# Patient Record
Sex: Female | Born: 1965 | Race: White | Hispanic: No | Marital: Married | Smoking: Never smoker
Health system: Southern US, Community
[De-identification: ages and names within clinical notes are randomized; demographics above are authoritative.]

## PROBLEM LIST (undated history)

## (undated) DIAGNOSIS — B019 Varicella without complication: Secondary | ICD-10-CM

## (undated) DIAGNOSIS — I341 Nonrheumatic mitral (valve) prolapse: Secondary | ICD-10-CM

## (undated) HISTORY — DX: Varicella without complication: B01.9

## (undated) HISTORY — DX: Nonrheumatic mitral (valve) prolapse: I34.1

## (undated) HISTORY — PX: ABDOMINAL HYSTERECTOMY: SHX81

---

## 1987-08-01 HISTORY — PX: WISDOM TOOTH EXTRACTION: SHX21

## 1996-07-31 HISTORY — PX: GANGLION CYST EXCISION: SHX1691

## 2018-09-05 ENCOUNTER — Encounter: Payer: Self-pay | Admitting: Family Medicine

## 2018-09-05 ENCOUNTER — Ambulatory Visit (INDEPENDENT_AMBULATORY_CARE_PROVIDER_SITE_OTHER): Payer: 59 | Admitting: Family Medicine

## 2018-09-05 VITALS — BP 112/80 | HR 60 | Temp 97.9°F | Ht 67.0 in | Wt 136.6 lb

## 2018-09-05 DIAGNOSIS — I341 Nonrheumatic mitral (valve) prolapse: Secondary | ICD-10-CM

## 2018-09-05 DIAGNOSIS — Z0000 Encounter for general adult medical examination without abnormal findings: Secondary | ICD-10-CM | POA: Diagnosis not present

## 2018-09-05 DIAGNOSIS — Z1239 Encounter for other screening for malignant neoplasm of breast: Secondary | ICD-10-CM | POA: Diagnosis not present

## 2018-09-05 DIAGNOSIS — Z862 Personal history of diseases of the blood and blood-forming organs and certain disorders involving the immune mechanism: Secondary | ICD-10-CM

## 2018-09-05 DIAGNOSIS — Z23 Encounter for immunization: Secondary | ICD-10-CM

## 2018-09-05 NOTE — Progress Notes (Signed)
Debbie Glover is a 53 y.o. female  Chief Complaint  Patient presents with  . Establish Care    CPE-- not fasting/ No OBGYN,/ Mammogram 2018/ colonoscopy 2017/ thinks she's receieved TDAP in 10 year/ wants to talk about flu shot? Cardiologist referral?    HPI: Debbie Glover is a 53 y.o. female here as a new patient to establish care with our office. She moved in 2018 from [place].  She is UTD on PAP, mammo, colo. She had Tdap in 2013. She would like flu vaccine today. She is not fasting for labs and will RTO to have these done.  Specialists: cardiology (annually) - MVP and syncopal episode x 1 in 2017  Last PAP: s/p partial hysterectomy - 2018 Last mammo: 2018 Last Dexa: n/a Last colonoscopy: 2016 - normal/no polyps - due in 2026  Med refills needed today: none   Past Medical History:  Diagnosis Date  . Chicken pox   . Mitral valve prolapse     Past Surgical History:  Procedure Laterality Date  . ABDOMINAL HYSTERECTOMY     just uterus  . GANGLION CYST EXCISION  1998  . WISDOM TOOTH EXTRACTION  1989    Social History   Socioeconomic History  . Marital status: Married    Spouse name: Not on file  . Number of children: Not on file  . Years of education: Not on file  . Highest education level: Not on file  Occupational History  . Not on file  Social Needs  . Financial resource strain: Not on file  . Food insecurity:    Worry: Not on file    Inability: Not on file  . Transportation needs:    Medical: Not on file    Non-medical: Not on file  Tobacco Use  . Smoking status: Never Smoker  . Smokeless tobacco: Never Used  Substance and Sexual Activity  . Alcohol use: Yes    Comment: socially  . Drug use: Never  . Sexual activity: Not on file  Lifestyle  . Physical activity:    Days per week: Not on file    Minutes per session: Not on file  . Stress: Not on file  Relationships  . Social connections:    Talks on phone: Not on file    Gets together: Not on  file    Attends religious service: Not on file    Active member of club or organization: Not on file    Attends meetings of clubs or organizations: Not on file    Relationship status: Not on file  . Intimate partner violence:    Fear of current or ex partner: Not on file    Emotionally abused: Not on file    Physically abused: Not on file    Forced sexual activity: Not on file  Other Topics Concern  . Not on file  Social History Narrative  . Not on file    Family History  Problem Relation Age of Onset  . Cancer Mother        breast  . Hypertension Mother   . Congestive Heart Failure Mother   . Pulmonary Hypertension Mother   . Cancer Father        prostate and skin  . Glaucoma Father   . Macular degeneration Father   . Diabetes Maternal Grandfather   . Stroke Maternal Grandfather   . Cancer Paternal Grandfather       There is no immunization history on file for this patient.  No outpatient encounter medications on file as of 09/05/2018.   No facility-administered encounter medications on file as of 09/05/2018.      ROS: Gen: no fever, chills  Skin: no rash, itching ENT: no ear pain, ear drainage, nasal congestion, rhinorrhea, sinus pressure, sore throat Eyes: no blurry vision, double vision Resp: no cough, wheeze,SOB CV: no CP, palpitations, LE edema,  GI: no heartburn, n/v/d/c, abd pain GU: no dysuria, urgency, frequency, hematuria  MSK: no joint pain, myalgias, back pain Neuro: no dizziness, headache, weakness, vertigo Psych: no depression, anxiety, insomnia   Allergies  Allergen Reactions  . Codeine Itching    BP 112/80   Pulse 60   Temp 97.9 F (36.6 C) (Oral)   Ht 5\' 7"  (1.702 m)   Wt 136 lb 9.6 oz (62 kg)   SpO2 97%   BMI 21.39 kg/m   Physical Exam  Constitutional: She is oriented to person, place, and time. She appears well-developed and well-nourished. No distress.  HENT:  Head: Normocephalic and atraumatic.  Right Ear: Tympanic membrane  and ear canal normal.  Left Ear: Tympanic membrane and ear canal normal.  Nose: Nose normal.  Mouth/Throat: Oropharynx is clear and moist.  Eyes: Pupils are equal, round, and reactive to light. Conjunctivae are normal.  Neck: Normal range of motion. Neck supple. No thyromegaly present.  Cardiovascular: Normal rate, regular rhythm and intact distal pulses.  Pulmonary/Chest: Effort normal and breath sounds normal. No respiratory distress. She has no wheezes. She has no rhonchi. Right breast exhibits no mass, no nipple discharge, no skin change and no tenderness. Left breast exhibits no mass, no nipple discharge, no skin change and no tenderness.  Abdominal: Bowel sounds are normal. She exhibits no distension and no mass. There is no abdominal tenderness.  Musculoskeletal: Normal range of motion.        General: No edema.  Lymphadenopathy:    She has no cervical adenopathy.  Neurological: She is alert and oriented to person, place, and time. She has normal reflexes. No cranial nerve deficit.  Skin: Skin is warm and dry.  Psychiatric: She has a normal mood and affect. Her behavior is normal.     A/P:  1. MVP (mitral valve prolapse) - Ambulatory referral to Cardiology  2. Screening for breast cancer - MM DIGITAL SCREENING BILATERAL; Future  3. Need for influenza vaccination - Flu Vaccine QUAD 6+ mos PF IM (Fluarix Quad PF)  4. Annual physical exam - cont with regular exercise (yoga, walking) and healthy diet - UTD on immunizations - will RTO for fasting labs - UTD on PAP, mammo, colo - ALT - AST - Basic metabolic panel - VITAMIN D 25 Hydroxy (Vit-D Deficiency, Fractures) - Lipid panel - CBC - Iron, TIBC and Ferritin Panel - next CPE in 1 year  5. History of anemia - CBC - Iron, TIBC and Ferritin Panel

## 2018-09-10 ENCOUNTER — Other Ambulatory Visit: Payer: 59

## 2018-09-13 ENCOUNTER — Other Ambulatory Visit (INDEPENDENT_AMBULATORY_CARE_PROVIDER_SITE_OTHER): Payer: 59

## 2018-09-13 DIAGNOSIS — Z0000 Encounter for general adult medical examination without abnormal findings: Secondary | ICD-10-CM | POA: Diagnosis not present

## 2018-09-13 DIAGNOSIS — Z862 Personal history of diseases of the blood and blood-forming organs and certain disorders involving the immune mechanism: Secondary | ICD-10-CM | POA: Diagnosis not present

## 2018-09-13 LAB — CBC
HCT: 40.1 % (ref 36.0–46.0)
Hemoglobin: 13.4 g/dL (ref 12.0–15.0)
MCHC: 33.3 g/dL (ref 30.0–36.0)
MCV: 90.4 fl (ref 78.0–100.0)
Platelets: 196 10*3/uL (ref 150.0–400.0)
RBC: 4.44 Mil/uL (ref 3.87–5.11)
RDW: 13 % (ref 11.5–15.5)
WBC: 4.7 10*3/uL (ref 4.0–10.5)

## 2018-09-13 LAB — LIPID PANEL
Cholesterol: 211 mg/dL — ABNORMAL HIGH (ref 0–200)
HDL: 93.1 mg/dL (ref >39.00)
LDL Cholesterol: 108 mg/dL — ABNORMAL HIGH (ref 0–99)
NonHDL: 117.71
TRIGLYCERIDES: 48 mg/dL (ref 0.0–149.0)
Total CHOL/HDL Ratio: 2
VLDL: 9.6 mg/dL (ref 0.0–40.0)

## 2018-09-13 LAB — BASIC METABOLIC PANEL
BUN: 11 mg/dL (ref 6–23)
CO2: 27 mEq/L (ref 19–32)
Calcium: 9.5 mg/dL (ref 8.4–10.5)
Chloride: 104 mEq/L (ref 96–112)
Creatinine, Ser: 0.8 mg/dL (ref 0.40–1.20)
GFR: 74.9 mL/min (ref >60.00)
Glucose, Bld: 91 mg/dL (ref 70–99)
Potassium: 4.4 mEq/L (ref 3.5–5.1)
SODIUM: 141 meq/L (ref 135–145)

## 2018-09-13 LAB — ALT: ALT: 15 U/L (ref 0–35)

## 2018-09-13 LAB — VITAMIN D 25 HYDROXY (VIT D DEFICIENCY, FRACTURES): VITD: 27.09 ng/mL — ABNORMAL LOW (ref 30.00–100.00)

## 2018-09-13 LAB — AST: AST: 21 U/L (ref 0–37)

## 2018-09-14 LAB — IRON,TIBC AND FERRITIN PANEL
%SAT: 40 % (calc) (ref 16–45)
Ferritin: 80 ng/mL (ref 16–232)
Iron: 117 ug/dL (ref 45–160)
TIBC: 293 mcg/dL (calc) (ref 250–450)

## 2018-09-16 ENCOUNTER — Encounter: Payer: Self-pay | Admitting: Family Medicine

## 2018-09-20 ENCOUNTER — Telehealth: Payer: Self-pay | Admitting: Family Medicine

## 2018-09-20 NOTE — Telephone Encounter (Signed)
Pt called back to get results.  Copied from CRM [id number]. Topic: Quick Communication - Lab Results (Clinic Use ONLY) >> Sep 17, 2018 10:44 AM Prewette, Cordie Grice, LPN wrote: Called patient to inform them of 09/05/2018 lab results. When patient returns call, triage nurse may disclose results.

## 2018-10-09 ENCOUNTER — Ambulatory Visit
Admission: RE | Admit: 2018-10-09 | Discharge: 2018-10-09 | Disposition: A | Discharge status: Home / Self Care | Payer: 59 | Source: Ambulatory Visit | Attending: Family Medicine | Admitting: Family Medicine

## 2018-10-09 ENCOUNTER — Other Ambulatory Visit: Payer: Self-pay

## 2018-10-09 DIAGNOSIS — Z1239 Encounter for other screening for malignant neoplasm of breast: Secondary | ICD-10-CM

## 2018-10-09 DIAGNOSIS — Z1231 Encounter for screening mammogram for malignant neoplasm of breast: Secondary | ICD-10-CM | POA: Diagnosis not present

## 2018-10-09 IMAGING — MG DIGITAL SCREENING BILATERAL MAMMOGRAM WITH CAD
5 series · 5 of 5 positions shown · non-contrast
Comparison: None.

CLINICAL DATA: Screening.

EXAM:
DIGITAL SCREENING BILATERAL MAMMOGRAM WITH CAD

[L MLO]
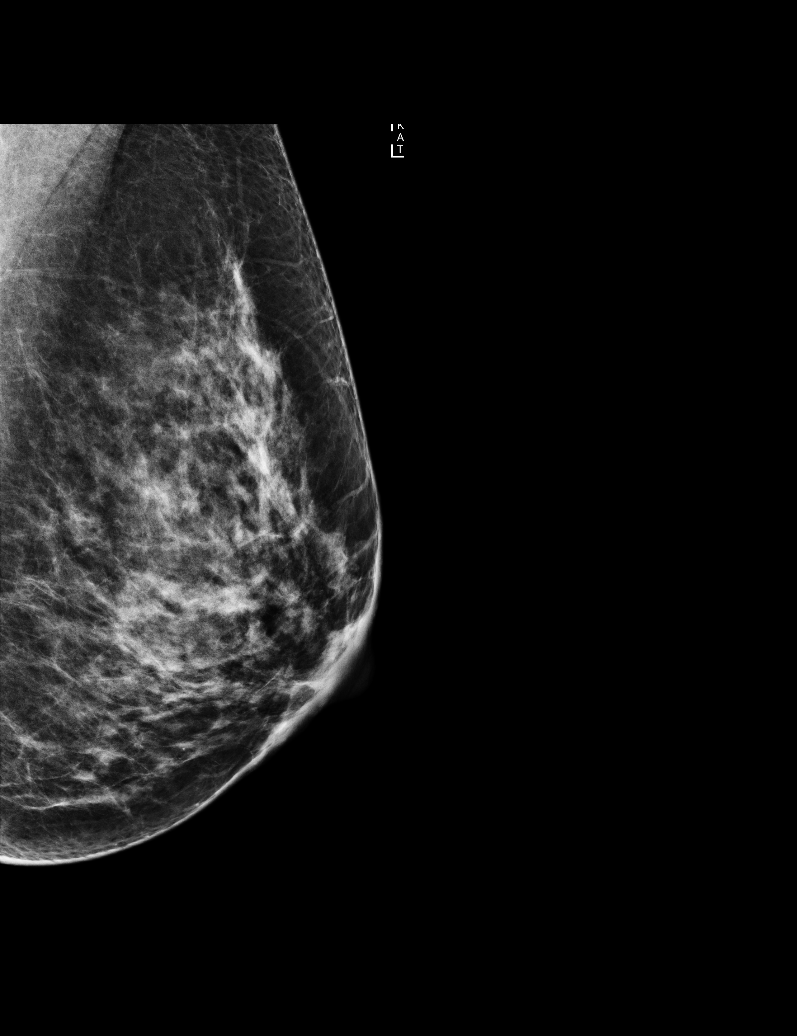

[R MLO (1 of 2)]
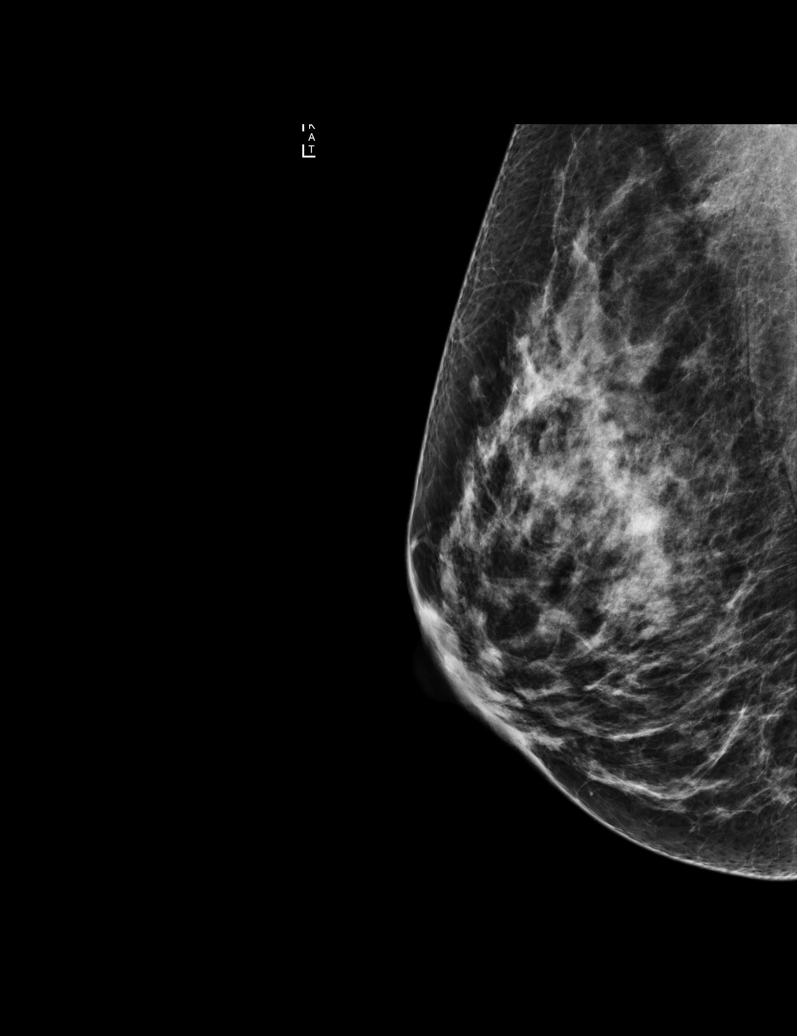

[R MLO (2 of 2)]
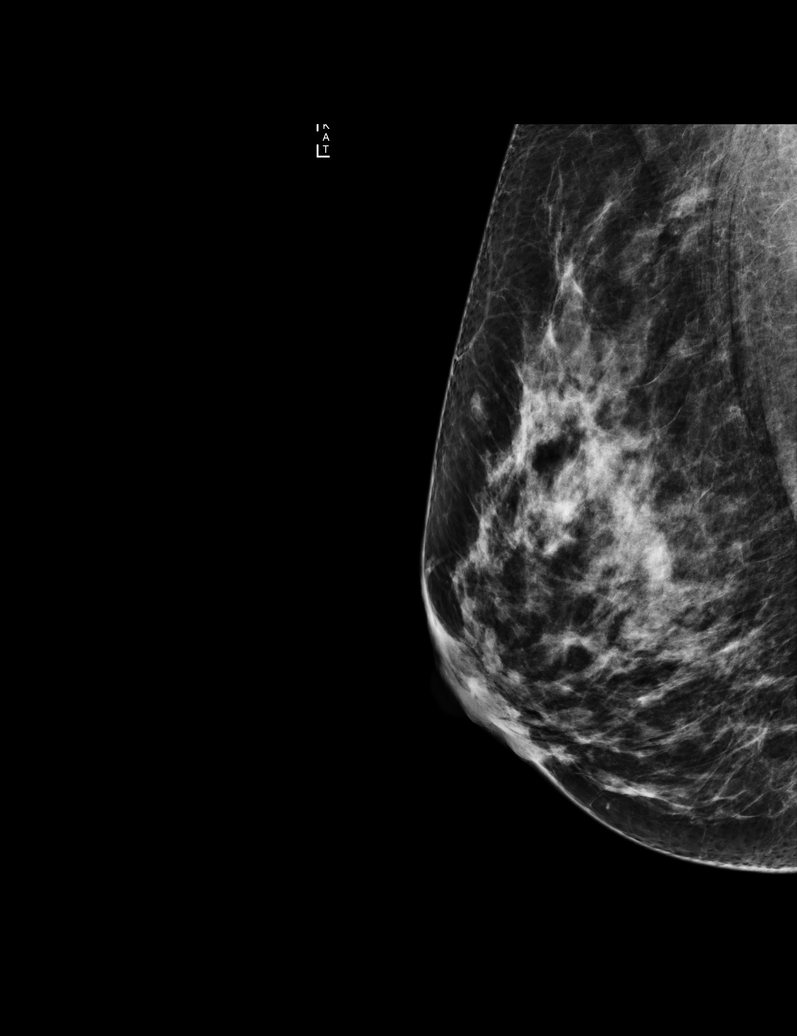

[R CC]
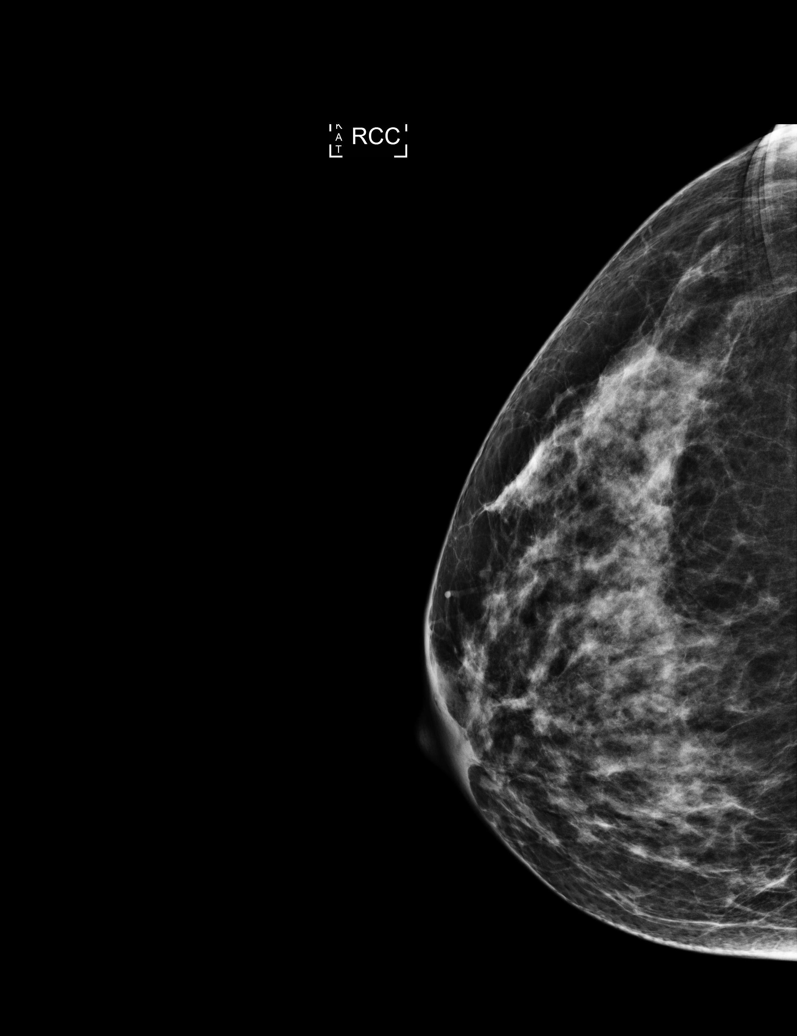

[L CC]
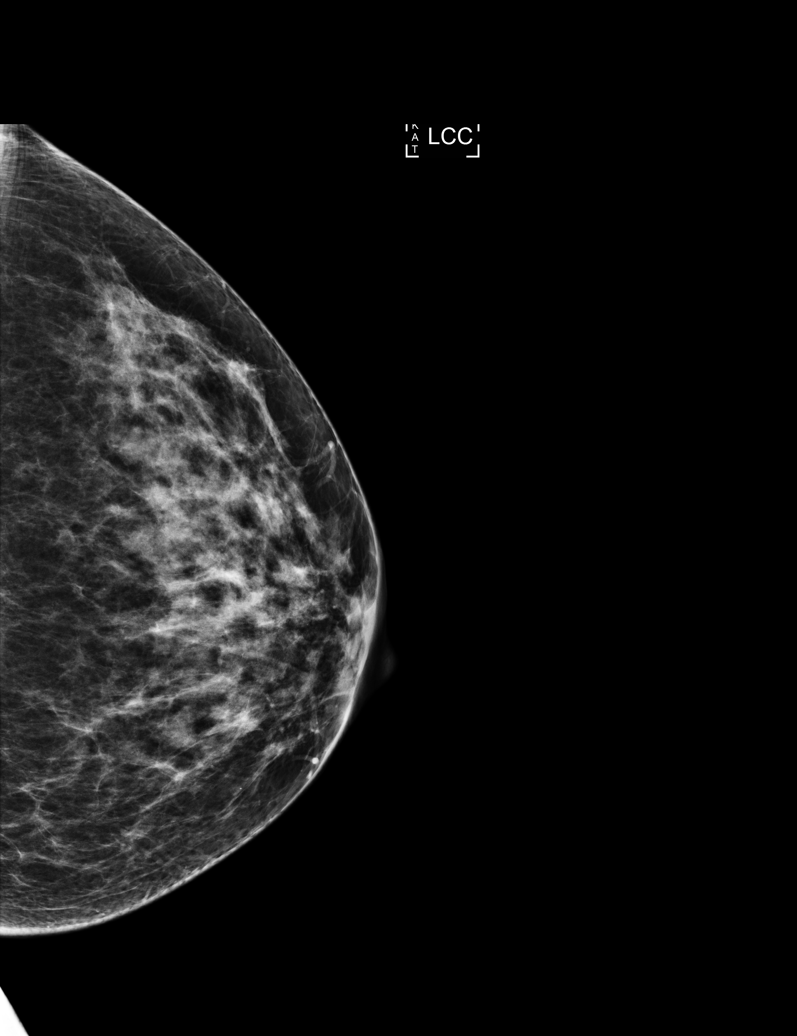

[5 of 5 positions shown; findings below may reference images not displayed]

ACR Breast Density Category c: The breast tissue is heterogeneously
dense, which may obscure small masses
FINDINGS: There are no findings suspicious for malignancy. Images were
processed with CAD.
IMPRESSION: No mammographic evidence of malignancy. A result letter of this
screening mammogram will be mailed directly to the patient.

RECOMMENDATION:
Screening mammogram in one year. (Code:[RG])

BI-RADS CATEGORY  1: Negative.

## 2018-10-18 ENCOUNTER — Ambulatory Visit: Payer: 59 | Admitting: Cardiovascular Disease

## 2018-11-04 ENCOUNTER — Telehealth: Payer: Self-pay

## 2018-11-04 NOTE — Telephone Encounter (Signed)
Spoke with pt who is agreeable to moving appt with Dr. Eden Emms to 4/8 at 11 am. Pt has been made aware to have a pencil and paper available with blood pressure, heart rate and weight 30 mins prior to appt. Pt has webex has downloaded successfully.  Pt verbalized understanding and thanked me for the call.

## 2018-11-04 NOTE — Telephone Encounter (Signed)

## 2018-11-05 ENCOUNTER — Telehealth: Payer: Self-pay

## 2018-11-05 NOTE — Progress Notes (Signed)
Virtual Visit via Video Note   This visit type was conducted due to national recommendations for restrictions regarding the COVID-19 Pandemic (e.g. social distancing) in an effort to limit this patient's exposure and mitigate transmission in our community.  Due to her co-morbid illnesses, this patient is at least at moderate risk for complications without adequate follow up.  This format is felt to be most appropriate for this patient at this time.  All issues noted in this document were discussed and addressed.  A limited physical exam was performed with this format.  Please refer to the patient's chart for her consent to telehealth for [facility].   Evaluation Performed:  Follow-up visit  Date:  11/05/2018   ID:  Debbie Betteramela Cecilio, DOB 09/02/1965, MRN [id number]  Patient Location: Home  Provider Location: Office  PCP:  Overton Mamirigliano, Mary K, DO  Cardiologist:  No primary care provider on file. New/Benigno Check Electrophysiologist:  None   Chief Complaint:  MVP/Syncope  History of Present Illness:    Debbie Glover is a 53 y.o. female who presents via audio/video conferencing for a telehealth visit today.    She is married to one of my patients They moved to [place] from [place] in 2018. 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 She is unsure about history of murmur and MVP seems to have been an echo diagnosis with mild MR by TTE 2017 She has not had recurrent syncope and monitor only showed PAC;s. She gets rare palpitations at this time that do not last long   No chest pain, dyspnea or TIA  The patient does not have symptoms concerning for COVID-19 infection (fever, chills, cough, or new shortness of breath).    Past Medical History:  Diagnosis Date  . Chicken pox   . Mitral valve prolapse    Past Surgical History:  Procedure Laterality Date  . ABDOMINAL HYSTERECTOMY     just uterus  . GANGLION CYST EXCISION  1998  . WISDOM TOOTH EXTRACTION  1989     No outpatient medications have been marked as taking for the 11/08/18 encounter (Appointment) with Wendall StadeNishan, Jairy Angulo C, MD.     Allergies:   Codeine   Social History   Tobacco Use  . Smoking status: Never Smoker  . Smokeless tobacco: Never Used  Substance Use Topics  . Alcohol use: Yes    Comment: socially  . Drug use: Never     Family Hx: The patient's family history includes Breast cancer in her mother; Cancer in her father and paternal grandfather; Congestive Heart Failure in her mother; Diabetes in her maternal grandfather; Glaucoma in her father; Hypertension in her mother; Macular degeneration in her father; Pulmonary Hypertension in her mother; Stroke in her maternal grandfather.  ROS:   Please see the history of present illness.     All other systems reviewed and are negative.   Prior CV studies:   The following studies were reviewed today:  None  Labs/Other Tests and Data Reviewed:    EKG:  NSR normal ECG   Recent Labs: 09/13/2018: ALT 15; BUN 11; Creatinine, Ser 0.80; Hemoglobin 13.4; Platelets 196.0; Potassium 4.4; Sodium 141   Recent Lipid Panel Lab Results  Component Value Date/Time   CHOL 211 (H) 09/13/2018 08:04 AM   TRIG 48.0 09/13/2018  08:04 AM   HDL 93.10 09/13/2018 08:04 AM   CHOLHDL 2 09/13/2018 08:04 AM   LDLCALC 108 (H) 09/13/2018 08:04 AM    Wt Readings from Last 3 Encounters:  09/05/18 62 kg     Objective:    Vital Signs:  There were no vitals taken for this visit.   Well nourished, well developed female in no acute distress. No tachypnea Skin warm and dry No JVP No edema   ASSESSMENT & PLAN:    1. MVP:  Benign sounding will do echo since new to [place] and assess for degree of MR 2. Syncope: 2017 no cardiac etiology found likely vasovagal 3. Anemia:  F/u primary  now Hct 40 with normal iron   4. PAC;s historical doubt significance checking echo for MVP    COVID-19 Education: The signs and symptoms of COVID-19 were discussed with the patient and how to seek care for testing (follow up with PCP or arrange E-visit).  The importance of social distancing was discussed today.  Time:   Today, I have spent 30 minutes with the patient with telehealth technology discussing the above problems.     Medication Adjustments/Labs and Tests Ordered: Current medicines are reviewed at length with the patient today.  Concerns regarding medicines are outlined above.  Tests Ordered:  TTE   Medication Changes: No orders of the defined types were placed in this encounter.   Disposition:  Follow up 3 months with echo when COVID 19 restrictions lifted  Signed, Charlton Haws, MD  11/05/2018 12:46 PM    [facility]

## 2018-11-05 NOTE — Telephone Encounter (Signed)
Called and left message for patient to call back. Need to see if patient can move appointment to Friday at 11:00 instead of tomorrow.

## 2018-11-06 ENCOUNTER — Telehealth: Payer: 59 | Admitting: Cardiovascular Disease

## 2018-11-08 ENCOUNTER — Telehealth (INDEPENDENT_AMBULATORY_CARE_PROVIDER_SITE_OTHER): Payer: 59 | Admitting: Cardiovascular Disease

## 2018-11-08 ENCOUNTER — Other Ambulatory Visit: Payer: Self-pay

## 2018-11-08 ENCOUNTER — Encounter: Payer: Self-pay | Admitting: Cardiovascular Disease

## 2018-11-08 VITALS — BP 114/72 | HR 71 | Ht 67.0 in | Wt 134.0 lb

## 2018-11-08 DIAGNOSIS — I341 Nonrheumatic mitral (valve) prolapse: Secondary | ICD-10-CM | POA: Diagnosis not present

## 2018-11-08 NOTE — Patient Instructions (Addendum)
Medication Instructions:   If you need a refill on your cardiac medications before your next appointment, please call your pharmacy.   Lab work:  If you have labs (blood work) drawn today and your tests are completely normal, you will receive your results only by: Marland Kitchen MyChart Message (if you have MyChart) OR . A paper copy in the mail If you have any lab test that is abnormal or we need to change your treatment, we will call you to review the results.  Testing/Procedures: Your physician has requested that you have an echocardiogram in 3 months. Echocardiography is a painless test that uses sound waves to create images of your heart. It provides your doctor with information about the size and shape of your heart and how well your heart's chambers and valves are working. This procedure takes approximately one hour. There are no restrictions for this procedure.  Follow-Up: At [facility], you and your health needs are our priority.  As part of our continuing mission to provide you with exceptional heart care, we have created designated Provider Care Teams.  These Care Teams include your primary Cardiologist (physician) and Advanced Practice Providers (APPs -  Physician Assistants and Nurse Practitioners) who all work together to provide you with the care you need, when you need it. You will need a follow up appointment in 3 months. You may see Dr. Eden Emms or one of the following Advanced Practice Providers on your designated Care Team:   Norma Fredrickson, NP Nada Boozer, NP . Georgie Chard, NP

## 2018-11-08 NOTE — Telephone Encounter (Signed)
Patient was able to move appt. Virtual visit was today.

## 2018-12-31 ENCOUNTER — Ambulatory Visit: Payer: 59 | Admitting: Cardiovascular Disease

## 2019-02-03 ENCOUNTER — Telehealth: Payer: Self-pay

## 2019-02-03 NOTE — Telephone Encounter (Signed)
Spoke with pt who states that she would like to keep virtual visit with Dr. Johnsie Cancel on 7/15.

## 2019-02-06 ENCOUNTER — Ambulatory Visit (HOSPITAL_COMMUNITY): Payer: 59 | Attending: Cardiovascular Disease

## 2019-02-06 ENCOUNTER — Other Ambulatory Visit: Payer: Self-pay

## 2019-02-06 DIAGNOSIS — I341 Nonrheumatic mitral (valve) prolapse: Secondary | ICD-10-CM | POA: Diagnosis present

## 2019-02-09 NOTE — Progress Notes (Signed)
Virtual Visit via Video Note   This visit type was conducted due to national recommendations for restrictions regarding the COVID-19 Pandemic (e.g. social distancing) in an effort to limit this patient's exposure and mitigate transmission in our community.  Due to her co-morbid illnesses, this patient is at least at moderate risk for complications without adequate follow up.  This format is felt to be most appropriate for this patient at this time.  All issues noted in this document were discussed and addressed.  A limited physical exam was performed with this format.  Please refer to the patient's chart for her consent to telehealth for [facility].   Evaluation Performed:  Follow-up visit  Date:  02/09/2019   ID:  Debbie Glover, DOB 06/08/1966, MRN [id number]  Patient Location: Home  Provider Location: Office  PCP:  Overton Mamirigliano, Debbie Glover  Cardiologist:  No primary care provider on file. New/Yoanna Glover Electrophysiologist:  None   Chief Complaint:  MVP/Syncope  History of Present Illness:    Debbie Glover is a 53 y.o. female who presents via audio/video conferencing for a telehealth visit today.    She is married to one of my patients They moved to [place] from [place] in 2018. 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 She is unsure about history of murmur and MVP seems to have been an echo diagnosis with mild MR by TTE 2017 She has not had recurrent syncope and monitor only showed PAC;s. She gets rare palpitations at this time that Glover not last long   No chest pain, dyspnea or TIA  Echo reviewed 02/06/19 EF 55-60% no prolapse mild MR   The patient does not have symptoms concerning for COVID-19 infection (fever, chills, cough, or new shortness of breath).    Past Medical History:   Diagnosis Date  . Chicken pox   . Mitral valve prolapse    Past Surgical History:  Procedure Laterality Date  . ABDOMINAL HYSTERECTOMY     just uterus  . GANGLION CYST EXCISION  1998  . WISDOM TOOTH EXTRACTION  1989     No outpatient medications have been marked as taking for the 02/12/19 encounter (Appointment) with Debbie Glover, Debbie Barsky C, MD.     Allergies:   Codeine   Social History   Tobacco Use  . Smoking status: Never Smoker  . Smokeless tobacco: Never Used  Substance Use Topics  . Alcohol use: Yes    Comment: socially  . Drug use: Never     Family Hx: The patient's family history includes Breast cancer in her mother; Cancer in her father and paternal grandfather; Congestive Heart Failure in her mother; Diabetes in her maternal grandfather; Glaucoma in her father; Hypertension in her mother; Macular degeneration in her father; Pulmonary Hypertension in her mother; Stroke in her maternal grandfather.  ROS:   Please see the history of present illness.     All other systems reviewed and are negative.   Prior CV studies:    Echo 02/06/19  Labs/Other Tests and Data Reviewed:    EKG:  NSR normal ECG   Recent Labs: 09/13/2018: ALT 15; BUN 11; Creatinine, Ser 0.80; Hemoglobin 13.4; Platelets 196.0; Potassium 4.4; Sodium 141   Recent Lipid Panel Lab Results  Component Value Date/Time   CHOL 211 (H) 09/13/2018  08:04 AM   TRIG 48.0 09/13/2018 08:04 AM   HDL 93.10 09/13/2018 08:04 AM   CHOLHDL 2 09/13/2018 08:04 AM   LDLCALC 108 (H) 09/13/2018 08:04 AM    Wt Readings from Last 3 Encounters:  11/08/18 134 lb (60.8 kg)  09/05/18 136 lb 9.6 oz (62 kg)     Objective:    Vital Signs:  There were no vitals taken for this visit.   Well nourished, well developed female in no acute distress. No tachypnea Skin warm and dry No JVP No edema   ASSESSMENT & PLAN:    1. MVP:  Historical diagnosis not seen on echo 02/06/19 mild MR observe  2. Syncope: 2017 no cardiac  etiology found likely vasovagal 3. Anemia:  F/u primary now Hct 40 with normal iron   4. PAC;s historical doubt significance observe    COVID-19 Education: The signs and symptoms of COVID-19 were discussed with the patient and how to seek care for testing (follow up with PCP or arrange E-visit).  The importance of social distancing was discussed today.  Time:   Today, I have spent 30 minutes with the patient with telehealth technology discussing the above problems.     Medication Adjustments/Labs and Tests Ordered: Current medicines are reviewed at length with the patient today.  Concerns regarding medicines are outlined above.  Tests Ordered:  None    Medication Changes: No orders of the defined types were placed in this encounter.   Disposition:  Follow up in a year   Signed, Jenkins Rouge, MD  02/09/2019 7:23 PM    [facility]

## 2019-02-12 ENCOUNTER — Telehealth (INDEPENDENT_AMBULATORY_CARE_PROVIDER_SITE_OTHER): Payer: 59 | Admitting: Cardiovascular Disease

## 2019-02-12 ENCOUNTER — Other Ambulatory Visit: Payer: Self-pay

## 2019-02-12 DIAGNOSIS — I340 Nonrheumatic mitral (valve) insufficiency: Secondary | ICD-10-CM | POA: Diagnosis not present

## 2019-02-12 NOTE — Patient Instructions (Signed)
Medication Instructions:  No changes If you need a refill on your cardiac medications before your next appointment, please call your pharmacy.   Lab work: none If you have labs (blood work) drawn today and your tests are completely normal, you will receive your results only by: Marland Kitchen MyChart Message (if you have MyChart) OR . A paper copy in the mail If you have any lab test that is abnormal or we need to change your treatment, we will call you to review the results.  Testing/Procedures: none  Follow-Up: At [facility], you and your health needs are our priority.  As part of our continuing mission to provide you with exceptional heart care, we have created designated Provider Care Teams.  These Care Teams include your primary Cardiologist (physician) and Advanced Practice Providers (APPs -  Physician Assistants and Nurse Practitioners) who all work together to provide you with the care you need, when you need it. You will need a follow up appointment in 12 months.  Please call our office 2 months in advance to schedule this appointment.  You may see Dr. Johnsie Cancel or one of the following Advanced Practice Providers on your designated Care Team:   Truitt Merle, NP Cecilie Kicks, NP . Kathyrn Drown, NP  Any Other Special Instructions Will Be Listed Below (If Applicable).

## 2019-10-07 ENCOUNTER — Other Ambulatory Visit: Payer: Self-pay | Admitting: Family Medicine

## 2019-10-07 DIAGNOSIS — Z1231 Encounter for screening mammogram for malignant neoplasm of breast: Secondary | ICD-10-CM

## 2019-12-10 ENCOUNTER — Other Ambulatory Visit: Payer: Self-pay

## 2019-12-10 ENCOUNTER — Ambulatory Visit
Admission: RE | Admit: 2019-12-10 | Discharge: 2019-12-10 | Disposition: A | Discharge status: Home / Self Care | Payer: 59 | Source: Ambulatory Visit | Attending: Family Medicine | Admitting: Family Medicine

## 2019-12-10 DIAGNOSIS — Z1231 Encounter for screening mammogram for malignant neoplasm of breast: Secondary | ICD-10-CM

## 2019-12-10 IMAGING — MG DIGITAL SCREENING BILAT W/ TOMO W/ CAD
8 series · 9 of 24 positions shown · non-contrast
Comparison: Previous exam(s).

CLINICAL DATA: Screening.

EXAM:
DIGITAL SCREENING BILATERAL MAMMOGRAM WITH TOMO AND CAD

[L CC synth-2D]
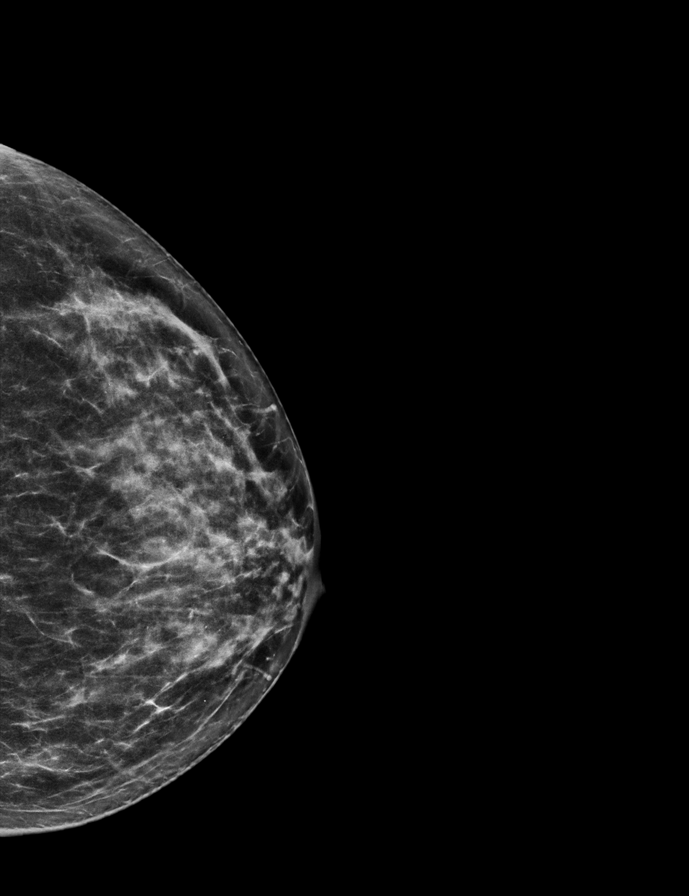

[R MLO synth-2D]
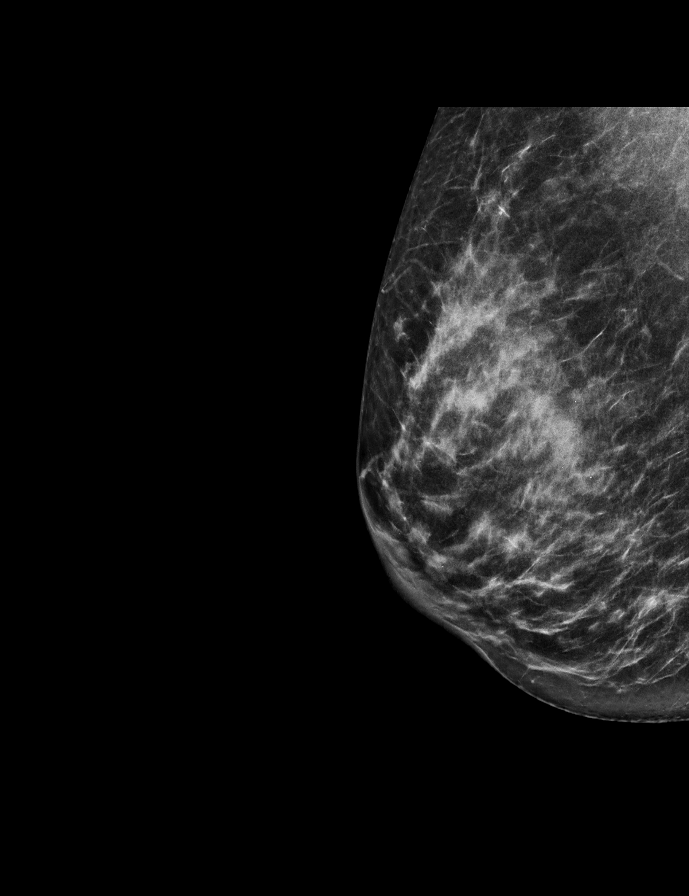

[R CC synth-2D]
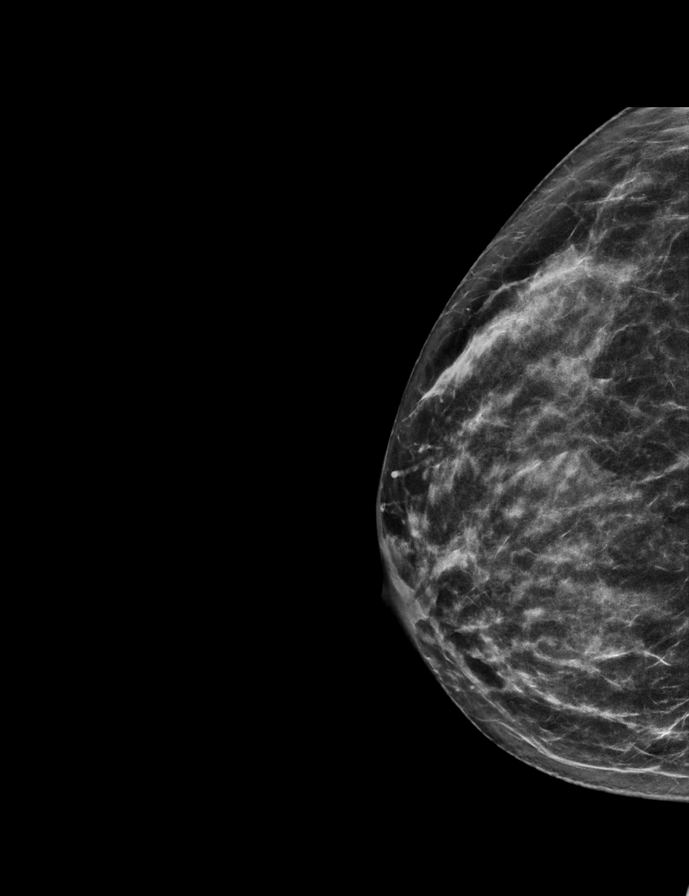

[L MLO synth-2D]
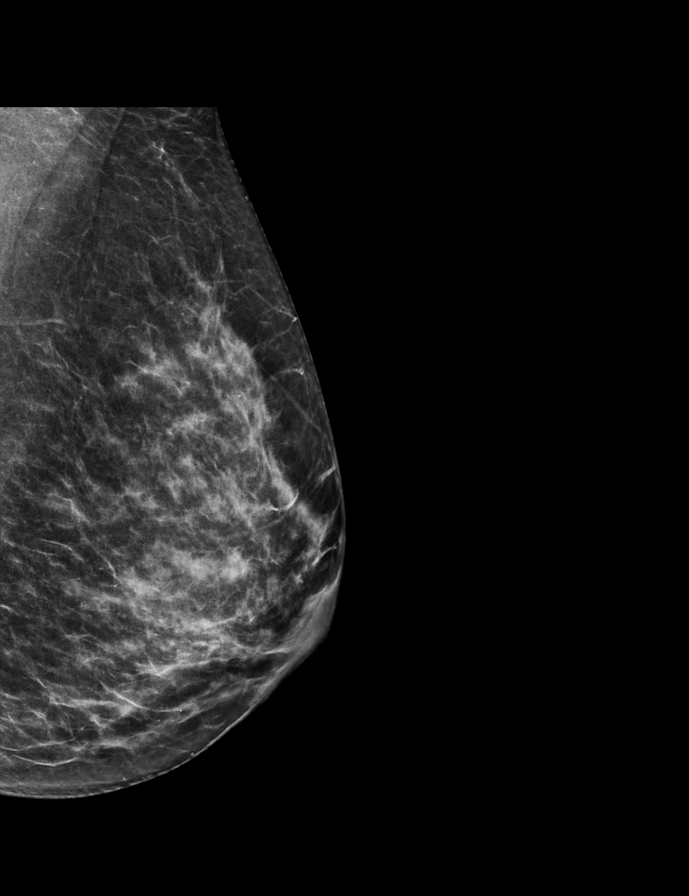

[R MLO tomo · 2 of 59 frames shown]
[frame 20/59]
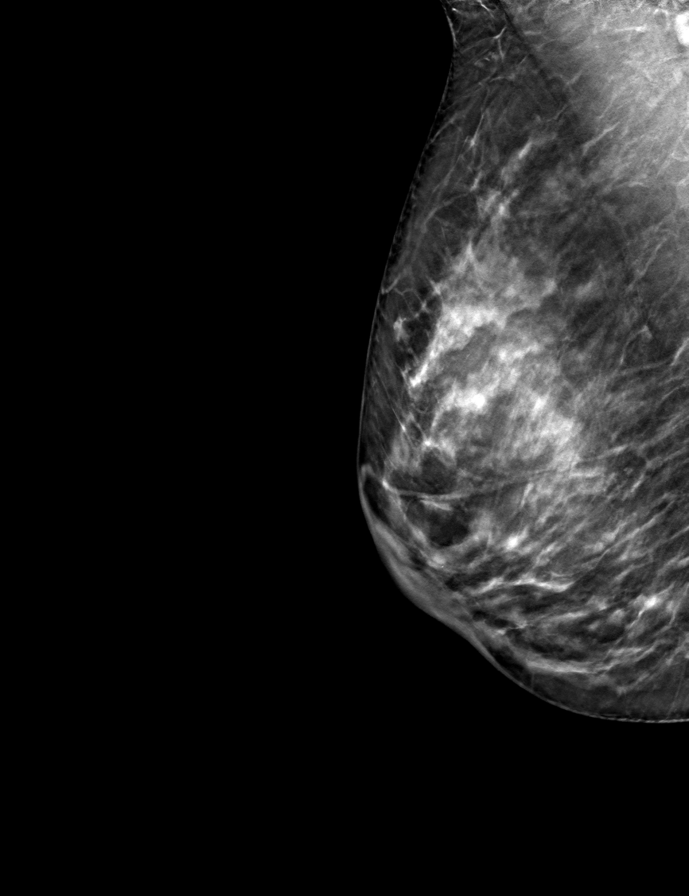
[frame 30/59]
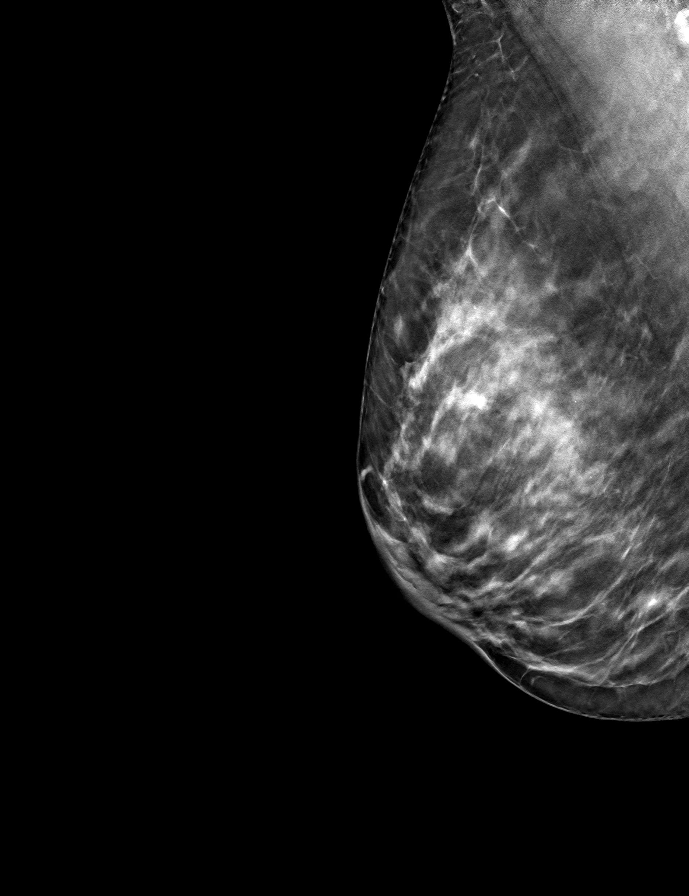

[R CC tomo · tomo slice 29/57.0]
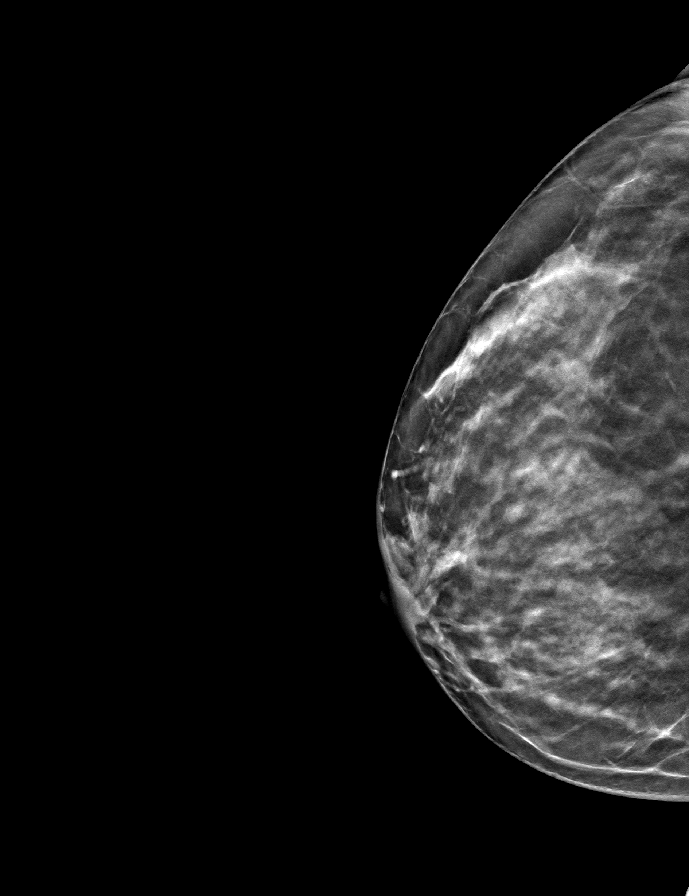

[L MLO tomo · tomo slice 29/58.0]
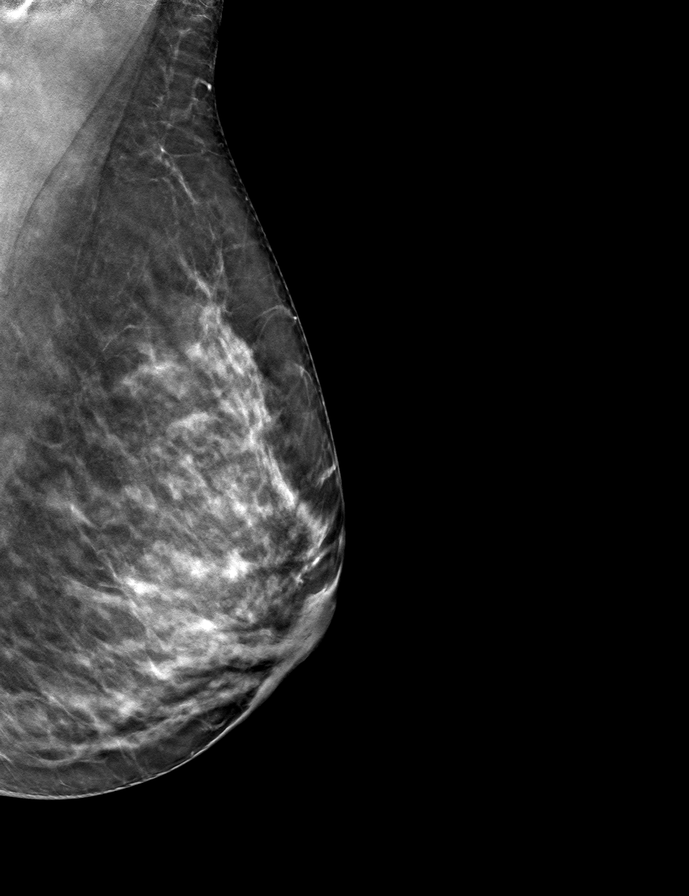

[L CC tomo · tomo slice 30/59.0]
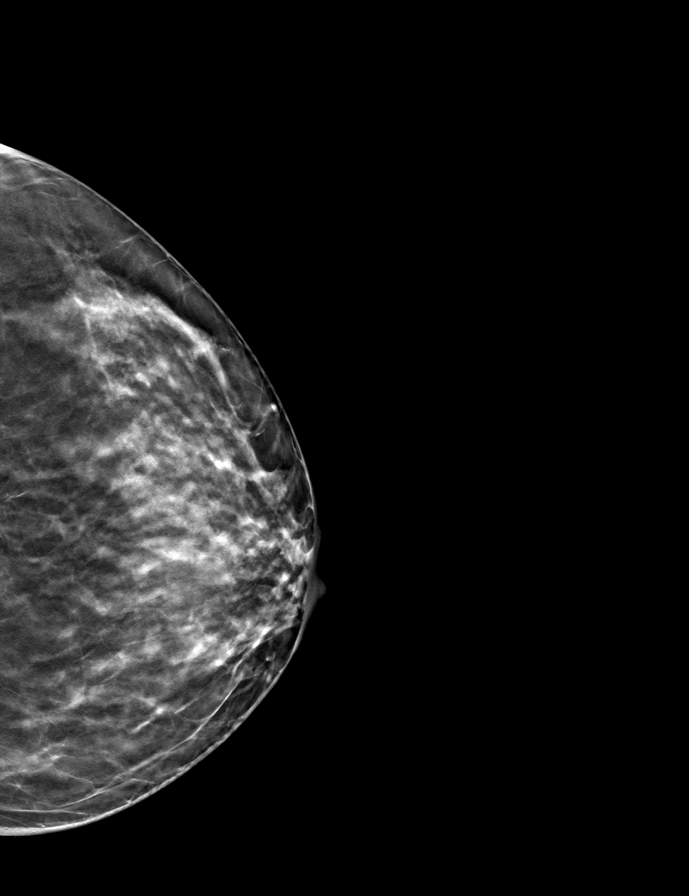

[9 of 24 positions shown; findings below may reference images not displayed]

ACR Breast Density Category c: The breast tissue is heterogeneously
dense, which may obscure small masses.
FINDINGS: There are no findings suspicious for malignancy. Images were
processed with CAD.
IMPRESSION: No mammographic evidence of malignancy. A result letter of this
screening mammogram will be mailed directly to the patient.

RECOMMENDATION:
Screening mammogram in one year. (Code:[5V])

BI-RADS CATEGORY  1: Negative.

## 2020-05-11 HISTORY — PX: BUNIONECTOMY: SHX129

## 2020-05-25 ENCOUNTER — Other Ambulatory Visit: Payer: Self-pay

## 2020-05-26 ENCOUNTER — Ambulatory Visit (INDEPENDENT_AMBULATORY_CARE_PROVIDER_SITE_OTHER): Payer: 59 | Admitting: Family Medicine

## 2020-05-26 ENCOUNTER — Encounter: Payer: Self-pay | Admitting: Family Medicine

## 2020-05-26 VITALS — BP 116/78 | HR 68 | Temp 97.4°F | Ht 67.0 in | Wt 133.4 lb

## 2020-05-26 DIAGNOSIS — Z862 Personal history of diseases of the blood and blood-forming organs and certain disorders involving the immune mechanism: Secondary | ICD-10-CM | POA: Diagnosis not present

## 2020-05-26 DIAGNOSIS — Z23 Encounter for immunization: Secondary | ICD-10-CM | POA: Diagnosis not present

## 2020-05-26 DIAGNOSIS — Z1321 Encounter for screening for nutritional disorder: Secondary | ICD-10-CM | POA: Diagnosis not present

## 2020-05-26 DIAGNOSIS — Z0000 Encounter for general adult medical examination without abnormal findings: Secondary | ICD-10-CM | POA: Diagnosis not present

## 2020-05-26 DIAGNOSIS — Z13220 Encounter for screening for lipoid disorders: Secondary | ICD-10-CM | POA: Diagnosis not present

## 2020-05-26 LAB — BASIC METABOLIC PANEL
BUN: 14 mg/dL (ref 6–23)
CO2: 30 mEq/L (ref 19–32)
Calcium: 9.3 mg/dL (ref 8.4–10.5)
Chloride: 98 mEq/L (ref 96–112)
Creatinine, Ser: 0.68 mg/dL (ref 0.40–1.20)
GFR: 98.22 mL/min (ref >60.00)
Glucose, Bld: 86 mg/dL (ref 70–99)
Potassium: 4.1 mEq/L (ref 3.5–5.1)
Sodium: 136 mEq/L (ref 135–145)

## 2020-05-26 LAB — LIPID PANEL
Cholesterol: 223 mg/dL — ABNORMAL HIGH (ref 0–200)
HDL: 82.1 mg/dL (ref >39.00)
LDL Cholesterol: 128 mg/dL — ABNORMAL HIGH (ref 0–99)
NonHDL: 141.3
Total CHOL/HDL Ratio: 3
Triglycerides: 66 mg/dL (ref 0.0–149.0)
VLDL: 13.2 mg/dL (ref 0.0–40.0)

## 2020-05-26 LAB — CBC
HCT: 37.9 % (ref 36.0–46.0)
Hemoglobin: 12.8 g/dL (ref 12.0–15.0)
MCHC: 33.7 g/dL (ref 30.0–36.0)
MCV: 90.6 fl (ref 78.0–100.0)
Platelets: 243 10*3/uL (ref 150.0–400.0)
RBC: 4.18 Mil/uL (ref 3.87–5.11)
RDW: 12.8 % (ref 11.5–15.5)
WBC: 5.6 10*3/uL (ref 4.0–10.5)

## 2020-05-26 LAB — VITAMIN D 25 HYDROXY (VIT D DEFICIENCY, FRACTURES): VITD: 38.97 ng/mL (ref 30.00–100.00)

## 2020-05-26 LAB — AST: AST: 30 U/L (ref 0–37)

## 2020-05-26 LAB — ALT: ALT: 26 U/L (ref 0–35)

## 2020-05-26 NOTE — Progress Notes (Signed)
Debbie Glover is a 54 y.o. female  Chief Complaint  Patient presents with  . Annual Exam    CPE/labs.  fasting this am.  c/o having hot flashes.  wants flu shot today    HPI: Debbie Glover is a 54 y.o. female seen today for annual CPE, fasting labs. She would like flu vaccine today.   Specialists: cardiology (annually) - MVP and syncopal episode x 1 in 2017  Last PAP: s/p partial hysterectomy in 2010 and last PAP 2018 - follows with GYN Last mammo: 11/2019 Last colonoscopy: 2016 - normal/no polyps - due in 2026  Diet/Exercise: well-balanced overall, walks regularly (2+ times per day) but recently had bunion surgery Dental: UTD Vision: wears glasses, UTD on exam  Med refills needed today? n/a   Past Medical History:  Diagnosis Date  . Chicken pox   . Mitral valve prolapse     Past Surgical History:  Procedure Laterality Date  . ABDOMINAL HYSTERECTOMY     just uterus  . BUNIONECTOMY  05/11/2020  . GANGLION CYST EXCISION  1998  . WISDOM TOOTH EXTRACTION  1989    Social History   Socioeconomic History  . Marital status: Married    Spouse name: Not on file  . Number of children: Not on file  . Years of education: Not on file  . Highest education level: Not on file  Occupational History  . Not on file  Tobacco Use  . Smoking status: Never Smoker  . Smokeless tobacco: Never Used  Vaping Use  . Vaping Use: Never used  Substance and Sexual Activity  . Alcohol use: Yes    Comment: socially  . Drug use: Never  . Sexual activity: Yes  Other Topics Concern  . Not on file  Social History Narrative  . Not on file   Social Determinants of Health   Financial Resource Strain:   . Difficulty of Paying Living Expenses: Not on file  Food Insecurity:   . Worried About Programme researcher, broadcasting/film/video in the Last Year: Not on file  . Ran Out of Food in the Last Year: Not on file  Transportation Needs:   . Lack of Transportation (Medical): Not on file  . Lack of Transportation  (Non-Medical): Not on file  Physical Activity:   . Days of Exercise per Week: Not on file  . Minutes of Exercise per Session: Not on file  Stress:   . Feeling of Stress : Not on file  Social Connections:   . Frequency of Communication with Friends and Family: Not on file  . Frequency of Social Gatherings with Friends and Family: Not on file  . Attends Religious Services: Not on file  . Active Member of Clubs or Organizations: Not on file  . Attends Banker Meetings: Not on file  . Marital Status: Not on file  Intimate Partner Violence:   . Fear of Current or Ex-Partner: Not on file  . Emotionally Abused: Not on file  . Physically Abused: Not on file  . Sexually Abused: Not on file    Family History  Problem Relation Age of Onset  . Hypertension Mother   . Congestive Heart Failure Mother   . Pulmonary Hypertension Mother   . Breast cancer Mother        had 3 times  . Cancer Father        prostate and skin  . Glaucoma Father   . Macular degeneration Father   . Diabetes Maternal Grandfather   .  Stroke Maternal Grandfather   . Cancer Paternal Grandfather      Immunization History  Administered Date(s) Administered  . Influenza,inj,Quad PF,6+ Mos 09/05/2018  . Moderna SARS-COVID-2 Vaccination 09/30/2019, 10/28/2019  . Tdap 09/01/2011  . Zoster Recombinat (Shingrix) 05/03/2020    Outpatient Encounter Medications as of 05/26/2020  Medication Sig  . cholecalciferol (VITAMIN D3) 25 MCG (1000 UT) tablet Take 1,000 Units by mouth daily.  Marland Kitchen oxyCODONE (OXY IR/ROXICODONE) 5 MG immediate release tablet Take 5 mg by mouth every 6 (six) hours as needed.   No facility-administered encounter medications on file as of 05/26/2020.     ROS: Gen: no fever, chills; + hot flashes, night sweats intermittently Skin: no rash, itching ENT: no ear pain, ear drainage, nasal congestion, rhinorrhea, sinus pressure, sore throat Eyes: no blurry vision, double vision Resp: no  cough, wheeze,SOB CV: no CP, palpitations, LE edema,  GI: no heartburn, n/v/d/c, abd pain GU: no dysuria, urgency, frequency, hematuria MSK: no joint pain, myalgias, back pain Neuro: no dizziness, headache, weakness, vertigo Psych: no depression, anxiety, insomnia   Allergies  Allergen Reactions  . Codeine Itching    BP 116/78   Pulse 68   Temp (!) 97.4 F (36.3 C) (Temporal)   Ht 5\' 7"  (1.702 m)   Wt 133 lb 6.4 oz (60.5 kg)   SpO2 99%   BMI 20.89 kg/m   Physical Exam Constitutional:      General: She is not in acute distress.    Appearance: She is well-developed.  HENT:     Head: Normocephalic and atraumatic.     Right Ear: Tympanic membrane and ear canal normal.     Left Ear: Tympanic membrane and ear canal normal.     Nose: Nose normal.  Eyes:     Conjunctiva/sclera: Conjunctivae normal.     Pupils: Pupils are equal, round, and reactive to light.  Neck:     Thyroid: No thyromegaly.  Cardiovascular:     Rate and Rhythm: Normal rate and regular rhythm.     Heart sounds: Normal heart sounds. No murmur heard.   Pulmonary:     Effort: Pulmonary effort is normal. No respiratory distress.     Breath sounds: Normal breath sounds. No wheezing or rhonchi.  Abdominal:     General: Bowel sounds are normal. There is no distension.     Palpations: Abdomen is soft. There is no mass.     Tenderness: There is no abdominal tenderness.  Musculoskeletal:     Cervical back: Neck supple.  Lymphadenopathy:     Cervical: No cervical adenopathy.  Skin:    General: Skin is warm and dry.  Neurological:     Mental Status: She is alert and oriented to person, place, and time.     Motor: No abnormal muscle tone.     Coordination: Coordination normal.  Psychiatric:        Behavior: Behavior normal.      A/P:  1. Annual physical exam - discussed importance of regular CV exercise, healthy diet, adequate sleep - UTD on colonoscopy and mammo; due for PAP and will schedule with  GYN - flu vaccine today and immunizations UTD - UTD on dental and vision - Basic metabolic panel - AST - ALT - Lipid panel - VITAMIN D 25 Hydroxy (Vit-D Deficiency, Fractures) - CBC - next CPE in 1 year  2. History of anemia - CBC  3. Screening for lipid disorders - Lipid panel  4. Encounter for vitamin deficiency screening -  VITAMIN D 25 Hydroxy (Vit-D Deficiency, Fractures)   This visit occurred during the SARS-CoV-2 public health emergency.  Safety protocols were in place, including screening questions prior to the visit, additional usage of staff PPE, and extensive cleaning of exam room while observing appropriate contact time as indicated for disinfecting solutions.

## 2020-05-26 NOTE — Addendum Note (Signed)
Addended by: Waymond Cera on: 05/26/2020 08:59 AM   Modules accepted: Orders

## 2020-05-26 NOTE — Patient Instructions (Signed)
[facility]  [email or web address] [address], [address] [place] [address] P: [phone] F: [phone] [email or web address] . Dr. Belva Agee  [facility] [email or web address] [address] [place], [place] [address] fax: [phone] [email or web address]

## 2020-06-03 ENCOUNTER — Encounter: Payer: Self-pay | Admitting: Family Medicine

## 2020-07-01 ENCOUNTER — Ambulatory Visit: Payer: 59 | Admitting: Cardiovascular Disease

## 2020-08-17 NOTE — Progress Notes (Signed)
Evaluation Performed:  Follow-up visit  Date:  08/26/2020   ID:  Debbie Glover, DOB 09/14/1965, MRN [id number]  PCP:  Overton Mam, DO  Cardiologist:  Charlton Haws, MD New/Debbie Glover Electrophysiologist:  None   Chief Complaint:  MVP/Syncope  History of Present Illness:    Debbie Glover is a 55 y.o. female married to one of my patients They moved to [facility] from [place] in 2018. 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 She is unsure about history of murmur and MVP seems to have been an echo diagnosis with mild MR by TTE 2017 She has not had recurrent syncope and monitor only showed PAC;s. She gets rare palpitations at this time that do not last long   No chest pain, dyspnea or TIA  Echo reviewed 02/06/19 EF 55-60% no prolapse mild MR   Bunion surgery end of last year walking again   Discussed LDL of 128 and utility of calcium score  The patient does not have symptoms concerning for COVID-19 infection (fever, chills, cough, or new shortness of breath).    Past Medical History:  Diagnosis Date  . Chicken pox   . Mitral valve prolapse    Past Surgical History:  Procedure Laterality Date  . ABDOMINAL HYSTERECTOMY     just uterus  . BUNIONECTOMY  05/11/2020  . GANGLION CYST EXCISION  1998  . WISDOM TOOTH EXTRACTION  1989     Current Meds  Medication Sig  . cholecalciferol (VITAMIN D3) 25 MCG (1000 UT) tablet Take 1,000 Units by mouth daily.  Marland Kitchen gabapentin (NEURONTIN) 300 MG capsule Take 300 mg by mouth 3 (three) times daily.     Allergies:   Codeine   Social History   Tobacco Use  . Smoking status: Never Smoker  . Smokeless tobacco: Never Used  Vaping Use  . Vaping Use: Never used  Substance Use Topics  . Alcohol use: Yes    Comment: socially  . Drug use: Never      Family Hx: The patient's family history includes Breast cancer in her mother; Cancer in her father and paternal grandfather; Congestive Heart Failure in her mother; Diabetes in her maternal grandfather; Glaucoma in her father; Hypertension in her mother; Macular degeneration in her father; Pulmonary Hypertension in her mother; Stroke in her maternal grandfather.  ROS:   Please see the history of present illness.     All other systems reviewed and are negative.   Prior CV studies:    Echo 02/06/19  Labs/Other Tests and Data Reviewed:    EKG:  NSR normal ECG 08/26/2020 SR rate 71 insig Q waves inferior leads   Recent Labs: 05/26/2020: ALT 26; BUN 14; Creatinine, Ser 0.68; Hemoglobin 12.8; Platelets 243.0; Potassium 4.1; Sodium 136   Recent Lipid Panel Lab Results  Component Value Date/Time   CHOL 223 (H) 05/26/2020 08:45 AM   TRIG 66.0 05/26/2020 08:45 AM   HDL 82.10 05/26/2020 08:45 AM   CHOLHDL 3 05/26/2020 08:45 AM   LDLCALC 128 (H) 05/26/2020 08:45 AM    Wt Readings from Last 3 Encounters:  08/26/20 62.1 kg  05/26/20 60.5 kg  11/08/18 60.8 kg     Objective:    Vital Signs:  BP 116/82   Pulse 71   Ht 5\' 7"  (1.702  m)   Wt 62.1 kg   SpO2 98%   BMI 21.43 kg/m    Affect appropriate Healthy:  appears stated age HEENT: normal Neck supple with no adenopathy JVP normal no bruits no thyromegaly Lungs clear with no wheezing and good diaphragmatic motion Heart:  S1/S2 no murmur, no rub, gallop or click PMI normal Abdomen: benighn, BS positve, no tenderness, no AAA no bruit.  No HSM or HJR Distal pulses intact with no bruits No edema Neuro non-focal Skin warm and dry No muscular weakness   ASSESSMENT & PLAN:    1. MVP:  Historical diagnosis not seen on echo 02/06/19 mild MR observe  2. Syncope: 2017 no cardiac etiology found likely vasovagal 3. Anemia:  F/u primary now Hct 40 with normal iron   4. PAC;s historical doubt significance observe  5. HLD:  128  calcium score if above average for age consider Rx   COVID-19 Education: The signs and symptoms of COVID-19 were discussed with the patient and how to seek care for testing (follow up with PCP or arrange E-visit).  The importance of social distancing was discussed today.  Time:   Today, I have spent 30 minutes with the patient with telehealth technology discussing the above problems.     Medication Adjustments/Labs and Tests Ordered: Current medicines are reviewed at length with the patient today.  Concerns regarding medicines are outlined above.  Tests Ordered:  Calcium Score    Medication Changes: No orders of the defined types were placed in this encounter.   Disposition:  Follow up in a year   Signed, Charlton Haws, MD  08/26/2020 2:54 PM    [facility]

## 2020-08-26 ENCOUNTER — Encounter: Payer: Self-pay | Admitting: Cardiovascular Disease

## 2020-08-26 ENCOUNTER — Ambulatory Visit: Payer: 59 | Admitting: Cardiovascular Disease

## 2020-08-26 ENCOUNTER — Other Ambulatory Visit: Payer: Self-pay

## 2020-08-26 ENCOUNTER — Ambulatory Visit
Admission: RE | Admit: 2020-08-26 | Discharge: 2020-08-26 | Disposition: A | Discharge status: Home / Self Care | Payer: Self-pay | Source: Ambulatory Visit | Attending: Cardiovascular Disease | Admitting: Cardiovascular Disease

## 2020-08-26 VITALS — BP 116/82 | HR 71 | Ht 67.0 in | Wt 136.8 lb

## 2020-08-26 DIAGNOSIS — I341 Nonrheumatic mitral (valve) prolapse: Secondary | ICD-10-CM | POA: Diagnosis not present

## 2020-08-26 IMAGING — CT CT CARDIAC CORONARY ARTERY CALCIUM SCORE
3 series · 14 of 20 positions shown, 15 images · non-contrast
Comparison: None.
COMPARISON: None.

Addendum:
EXAM:
OVER-READ INTERPRETATION  CT CHEST

The following report is an over-read performed by radiologist Dr.
JELLAL [REDACTED] on [DATE]. This over-read
does not include interpretation of cardiac or coronary anatomy or
pathology. The coronary calcium score interpretation by the
cardiologist is attached.
CLINICAL DATA: Risk stratification
Coronary Calcium Score
TECHNIQUE: The patient was scanned on a Siemens Somatom 64 slice scanner. Axial
non-contrast 3 mm slices were carried out through the heart. The
data set was analyzed on a dedicated work station and scored using
the Agatson method.

[Series 2: casc 3.0 bv41 2 bestdiast 70 % · axial · 0.34mm/px · z∈[-257,-194]mm · 4 of 36 slices shown, 5 images]
[im 8/36  vessel]
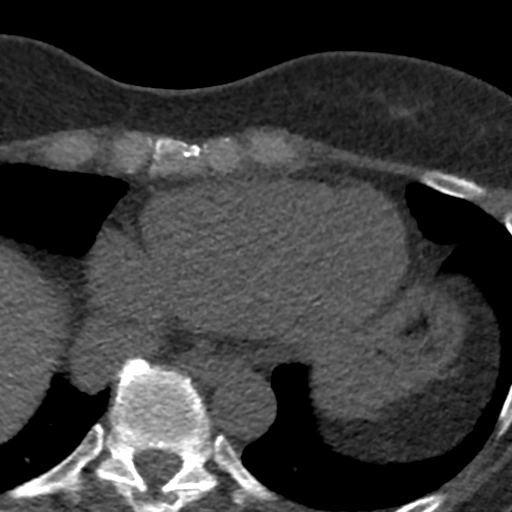
[im 8/36  lung]
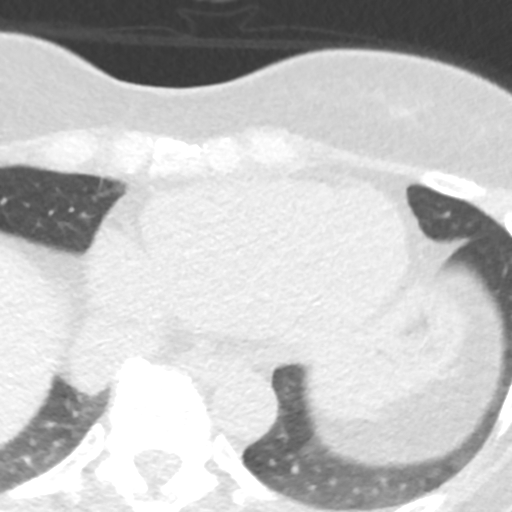
[im 15/36  vessel]
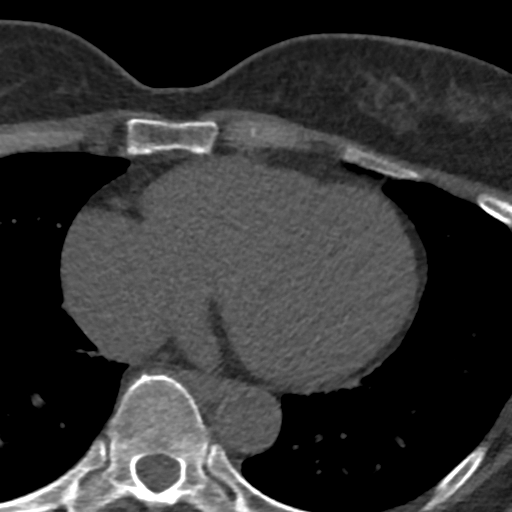
[im 22/36  vessel]
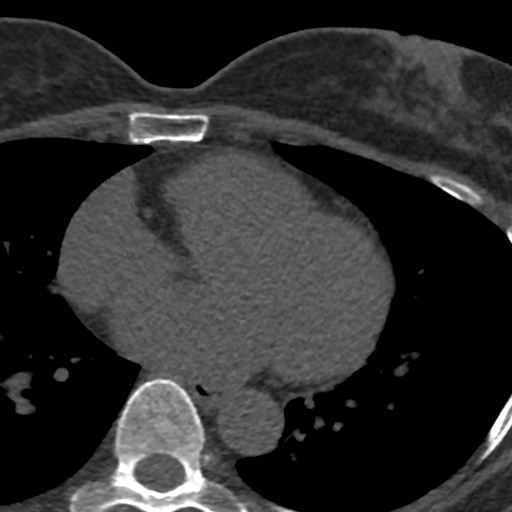
[im 29/36  vessel]
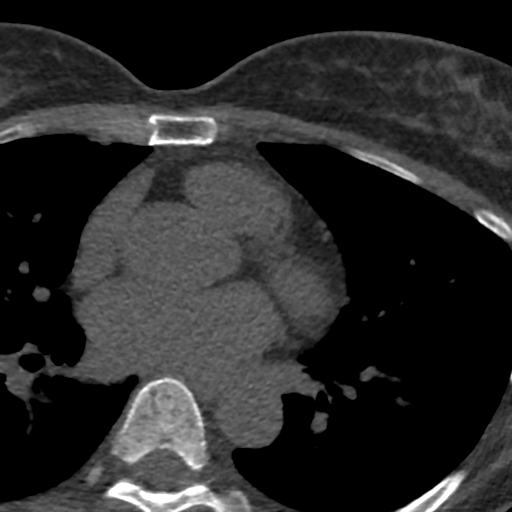

[Series 3: lung 71 % · axial · 0.57mm/px · z∈[-263,-191]mm · 5 of 36 slices shown]
[im 6/36  lung]
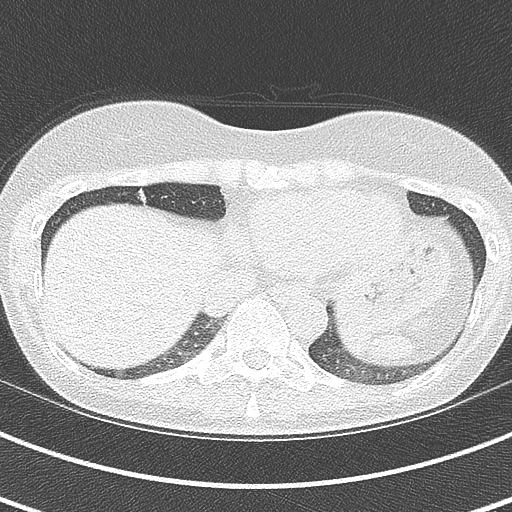
[im 12/36  lung]
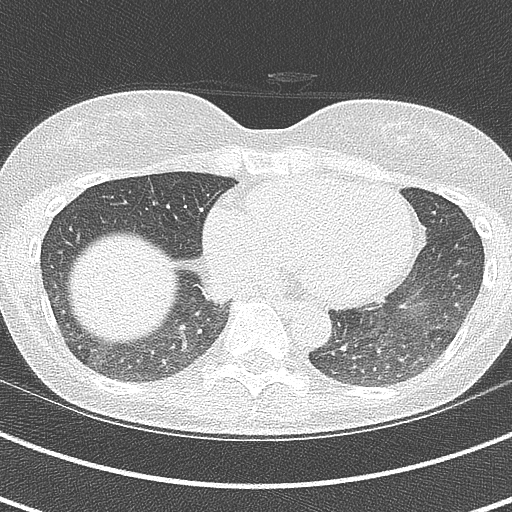
[im 18/36  lung]
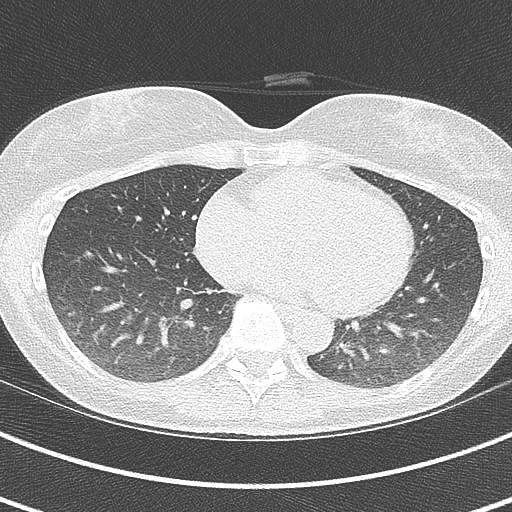
[im 24/36  lung]
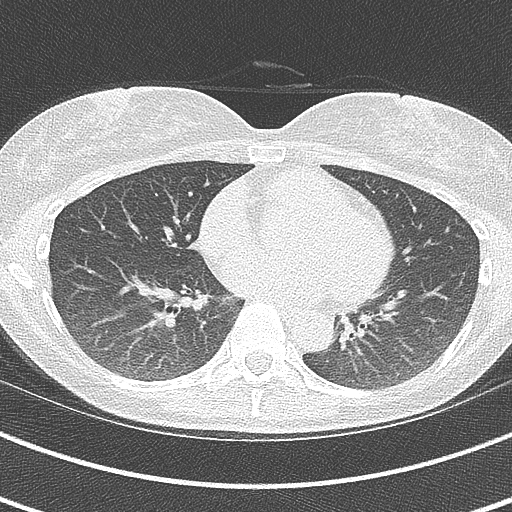
[im 30/36  lung]
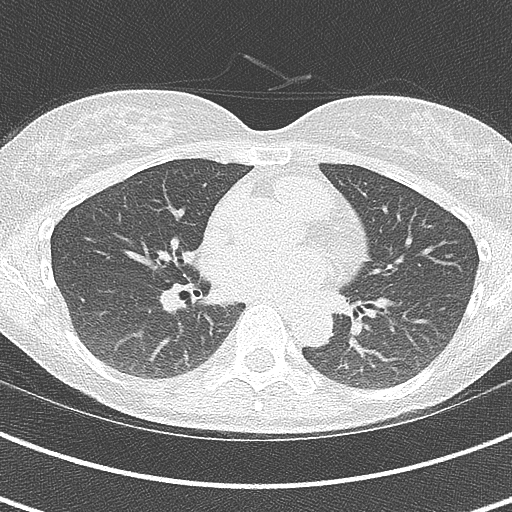

[Series 4: lung st 71 % · axial · 0.57mm/px · z∈[-263,-191]mm · 5 of 36 slices shown]
[im 6/36  lung]
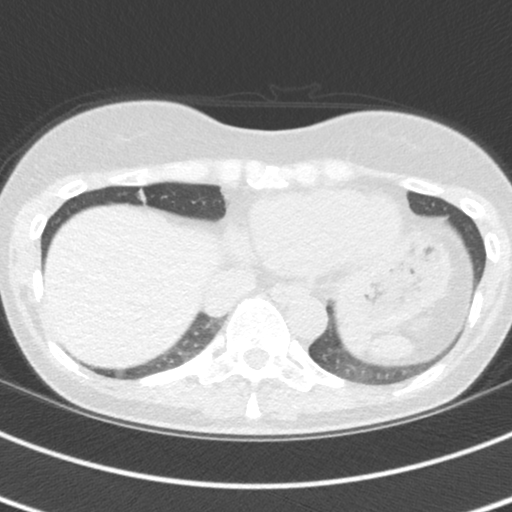
[im 12/36  lung]
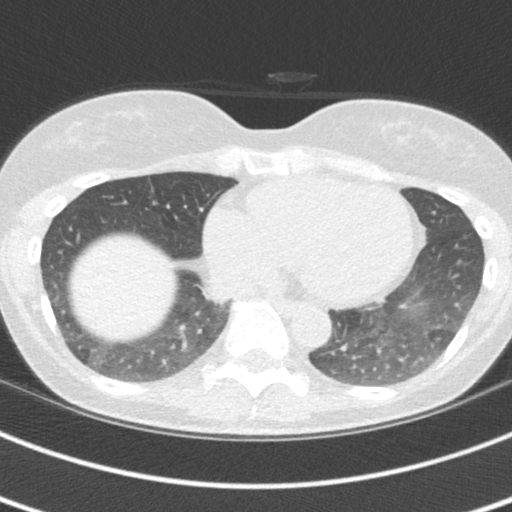
[im 18/36  lung]
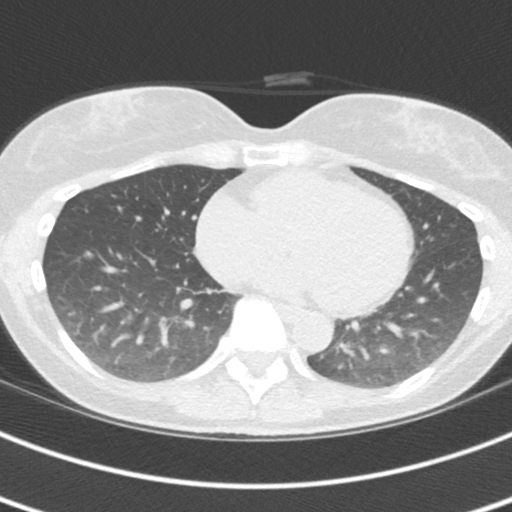
[im 24/36  lung]
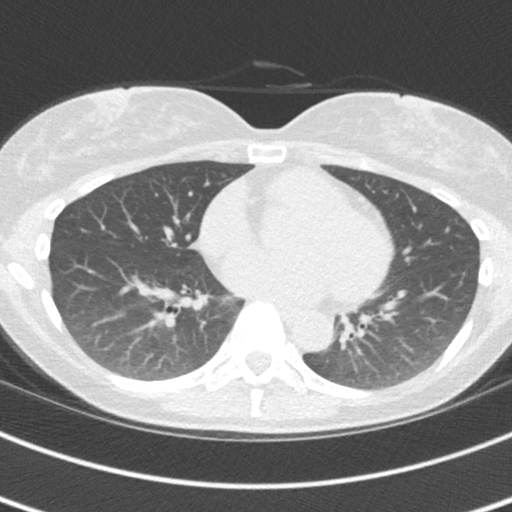
[im 30/36  lung]
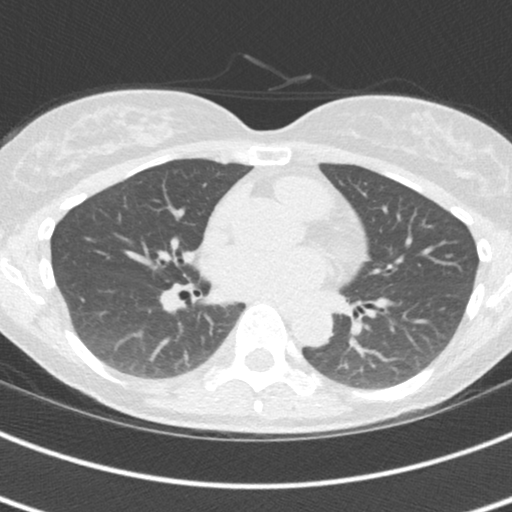

[14 of 20 positions shown; findings below may reference images not displayed]

FINDINGS: Vascular: Heart is normal size.  Aorta normal caliber.

Mediastinum/Nodes: No adenopathy.

Lungs/Pleura: No confluent opacities or effusions.

Upper Abdomen: Imaging into the upper abdomen demonstrates no acute
findings.

Musculoskeletal: Chest wall soft tissues are unremarkable. No acute
bony abnormality.
IMPRESSION: No acute or significant extracardiac abnormality.
FINDINGS: Non-cardiac: See separate report from [REDACTED].

Ascending aorta: Normal diameter 3.1 cm

Pericardium: Normal

Coronary arteries: No calcium noted
IMPRESSION: Coronary calcium score of 0.

JELLAL

*** End of Addendum ***
EXAM:
OVER-READ INTERPRETATION  CT CHEST

The following report is an over-read performed by radiologist Dr.
JELLAL [REDACTED] on [DATE]. This over-read
does not include interpretation of cardiac or coronary anatomy or
pathology. The coronary calcium score interpretation by the
cardiologist is attached.
FINDINGS: Vascular: Heart is normal size.  Aorta normal caliber.

Mediastinum/Nodes: No adenopathy.

Lungs/Pleura: No confluent opacities or effusions.

Upper Abdomen: Imaging into the upper abdomen demonstrates no acute
findings.

Musculoskeletal: Chest wall soft tissues are unremarkable. No acute
bony abnormality.
IMPRESSION: No acute or significant extracardiac abnormality.

## 2020-08-26 NOTE — Patient Instructions (Addendum)
Medication Instructions:  *If you need a refill on your cardiac medications before your next appointment, please call your pharmacy*  Lab Work: If you have labs (blood work) drawn today and your tests are completely normal, you will receive your results only by: Marland Kitchen MyChart Message (if you have MyChart) OR . A paper copy in the mail If you have any lab test that is abnormal or we need to change your treatment, we will call you to review the results.  Testing/Procedures: Cardiac CT scanning for calcium score, (CAT scanning) (today if possible), is a noninvasive, special x-ray that produces cross-sectional images of the body using x-rays and a computer. CT scans help physicians diagnose and treat medical conditions. For some CT exams, a contrast material is used to enhance visibility in the area of the body being studied. CT scans provide greater clarity and reveal more details than regular x-ray exams.  Follow-Up: At [facility], you and your health needs are our priority.  As part of our continuing mission to provide you with exceptional heart care, we have created designated Provider Care Teams.  These Care Teams include your primary Cardiologist (physician) and Advanced Practice Providers (APPs -  Physician Assistants and Nurse Practitioners) who all work together to provide you with the care you need, when you need it.  We recommend signing up for the patient portal called "MyChart".  Sign up information is provided on this After Visit Summary.  MyChart is used to connect with patients for Virtual Visits (Telemedicine).  Patients are able to view lab/test results, encounter notes, upcoming appointments, etc.  Non-urgent messages can be sent to your provider as well.   To learn more about what you can do with MyChart, go to [email or web address].    Your next appointment:   1 year(s)  The format for your next appointment:   In Person  Provider:   You may see Charlton Haws, MD or one of  the following Advanced Practice Providers on your designated Care Team:    Georgie Chard, NP

## 2020-09-03 DIAGNOSIS — R87619 Unspecified abnormal cytological findings in specimens from cervix uteri: Secondary | ICD-10-CM | POA: Insufficient documentation

## 2020-09-09 DIAGNOSIS — Z9189 Other specified personal risk factors, not elsewhere classified: Secondary | ICD-10-CM | POA: Insufficient documentation

## 2020-09-28 ENCOUNTER — Other Ambulatory Visit: Payer: Self-pay | Admitting: Obstetrics and Gynecology

## 2020-09-28 DIAGNOSIS — Z9189 Other specified personal risk factors, not elsewhere classified: Secondary | ICD-10-CM

## 2020-10-15 ENCOUNTER — Other Ambulatory Visit: Payer: Self-pay

## 2020-10-15 ENCOUNTER — Ambulatory Visit
Admission: RE | Admit: 2020-10-15 | Discharge: 2020-10-15 | Disposition: A | Discharge status: Home / Self Care | Payer: 59 | Source: Ambulatory Visit | Attending: Obstetrics and Gynecology | Admitting: Obstetrics and Gynecology

## 2020-10-15 DIAGNOSIS — Z9189 Other specified personal risk factors, not elsewhere classified: Secondary | ICD-10-CM

## 2020-10-15 IMAGING — MR MR BREAST BILAT WO/W CM
8 of 12 series · 33 of 48 positions shown · IV contrast (7 mL Gadavist)
Comparison: No previous breast MRI

CLINICAL DATA: High risk for breast cancer. Family history of
breast cancer, including mother and aunt. Dense breasts.

LABS:  Not performed at [REDACTED].
EXAM:
BILATERAL BREAST MRI WITH AND WITHOUT CONTRAST
TECHNIQUE: Multiplanar, multisequence MR images of both breasts were obtained
prior to and following the intravenous administration of 7 ml of
Gadavist

[Series 2: t2_tirm_tra ipat (a-p) · axial · 3.0mm · 0.68mm/px · 1 of 56 slices shown]
[im 1/56]
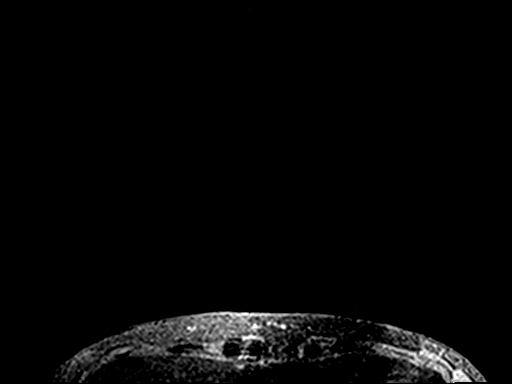

[Series 3: fl3d pre-cm no · axial · non-contrast · 1.2mm · 0.91mm/px · z∈[-67,+123]mm · 5 of 160 slices shown]
[im 1/160]
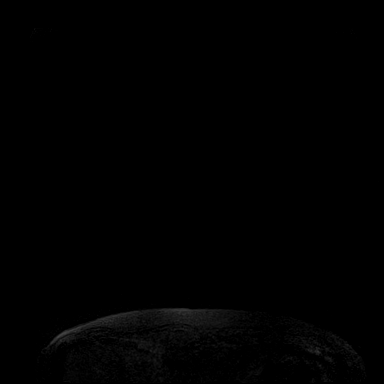
[im 40/160]
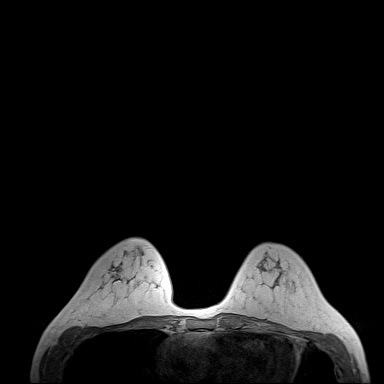
[im 80/160]
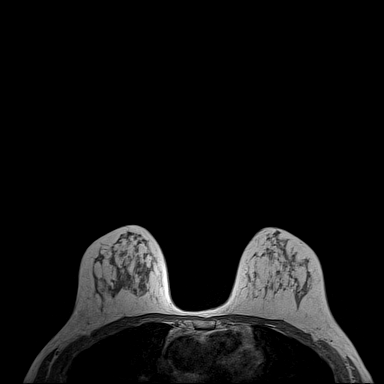
[im 120/160]
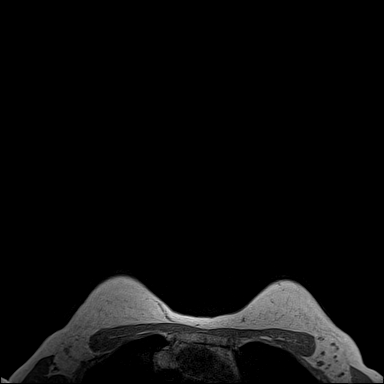
[im 160/160]
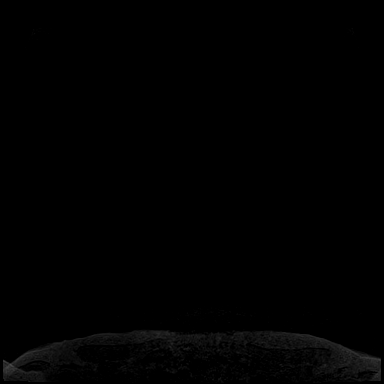

[Series 4: fl3d pre-cm · axial · non-contrast · 1.2mm · 0.94mm/px · z∈[-58,+114]mm · 5 of 144 slices shown]
[im 1/144]
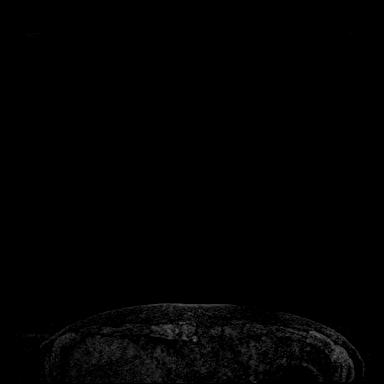
[im 36/144]
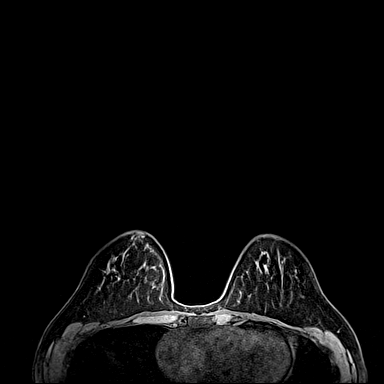
[im 72/144]
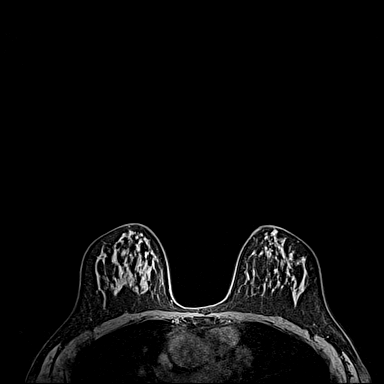
[im 108/144]
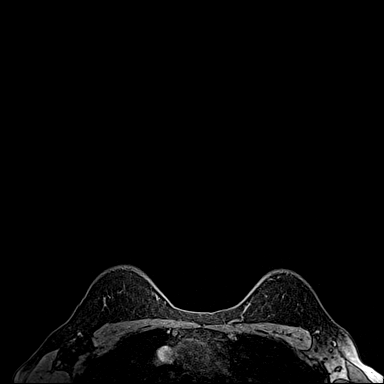
[im 144/144]
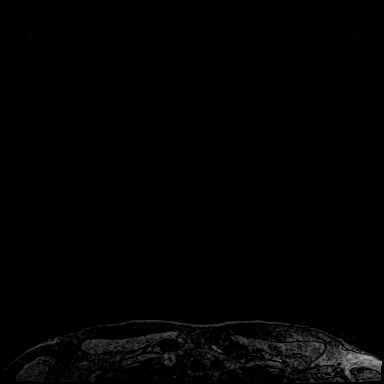

[Series 5: fl3d post immediate · axial · 1.2mm · 0.94mm/px · z∈[-58,+114]mm · 5 of 144 slices shown (1 of 3)]
[im 1/144]
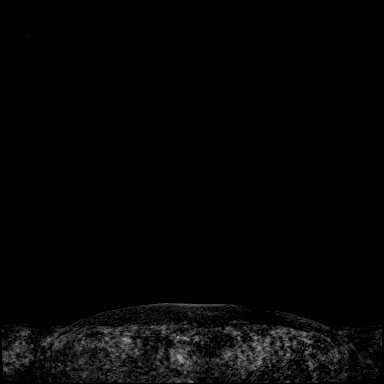
[im 36/144]
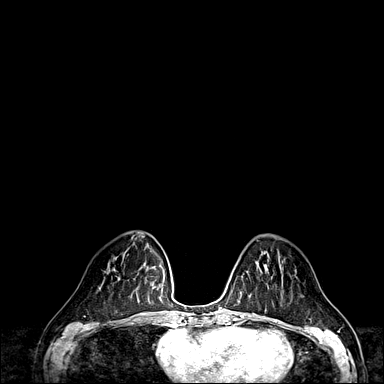
[im 72/144]
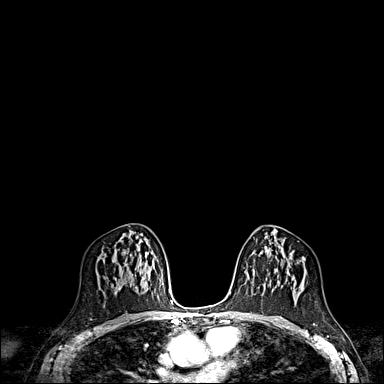
[im 108/144]
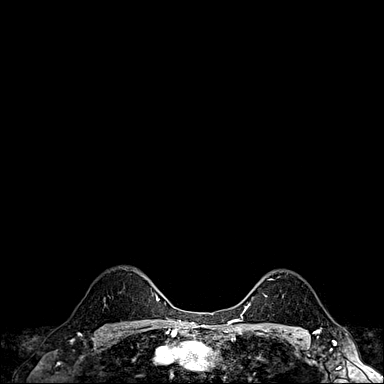
[im 144/144]
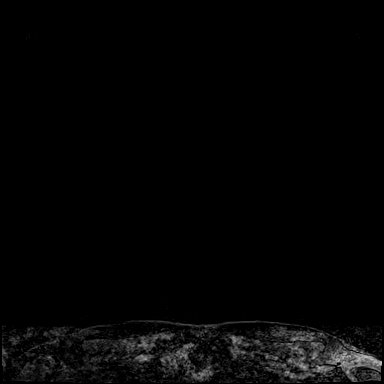

[Series 6: fl3d post immediate · axial · 1.2mm · 0.94mm/px · z∈[-58,+114]mm · 5 of 144 slices shown (2 of 3)]
[im 1/144]
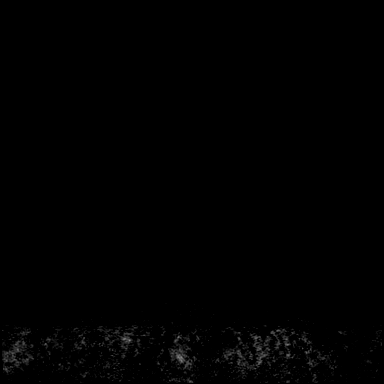
[im 36/144]
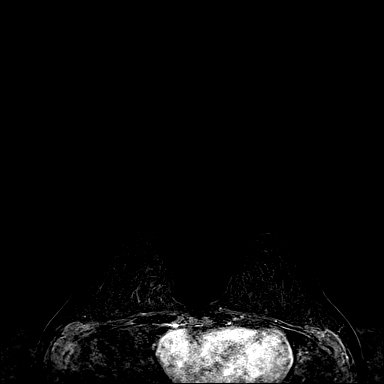
[im 72/144]
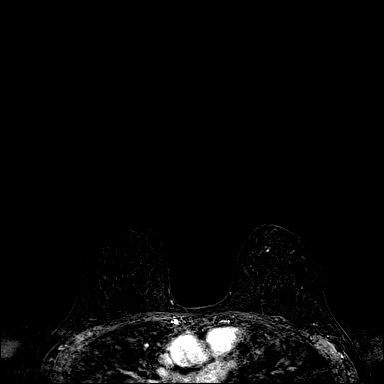
[im 108/144]
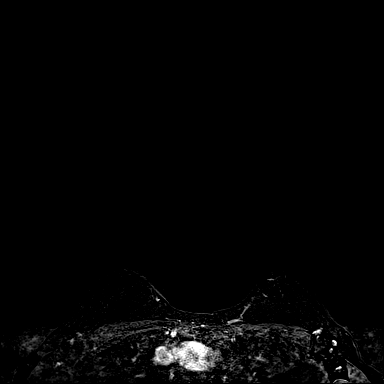
[im 144/144]
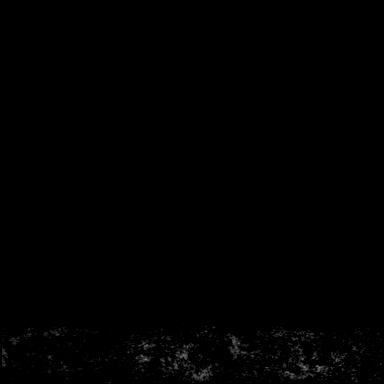

[Series 7: fl3d post immediate · axial · 172.8mm · 0.94mm/px · 1 of 1 slices shown (3 of 3)]
[im 1/1]
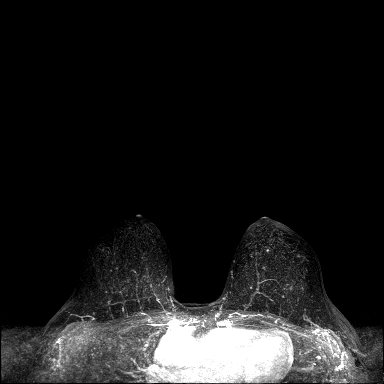

[Series 8: fl3d post 3min · axial · 1.2mm · 0.94mm/px · z∈[-58,+114]mm · 6 of 144 slices shown]
[im 1/144]
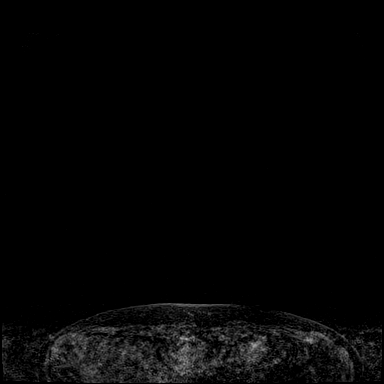
[im 29/144]
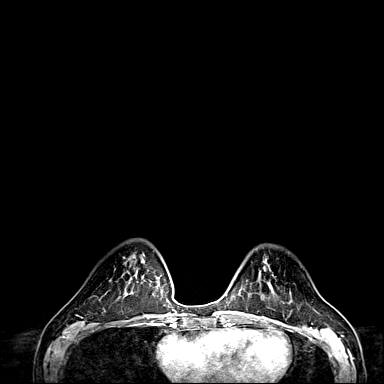
[im 58/144]
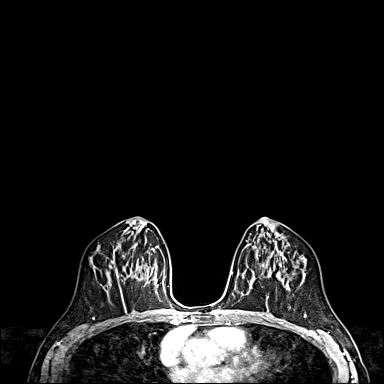
[im 86/144]
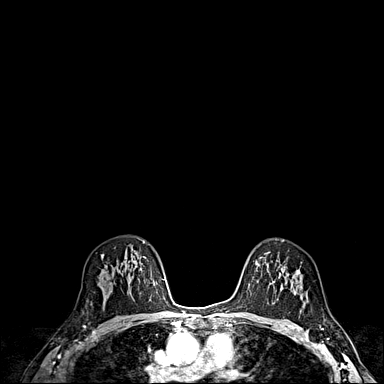
[im 115/144]
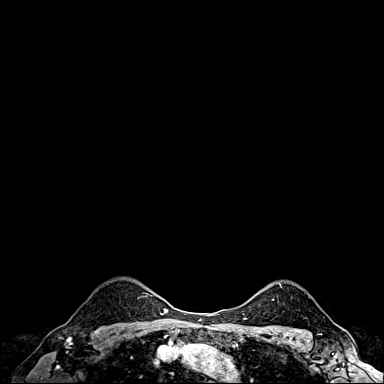
[im 144/144]
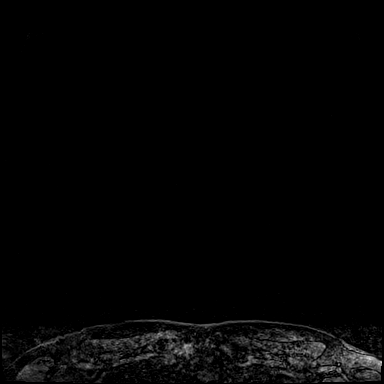

[Series 9: fl3d post 3min_sub · axial · 1.2mm · 0.94mm/px · z∈[-58,+79]mm · 5 of 144 slices shown]
[im 1/144]
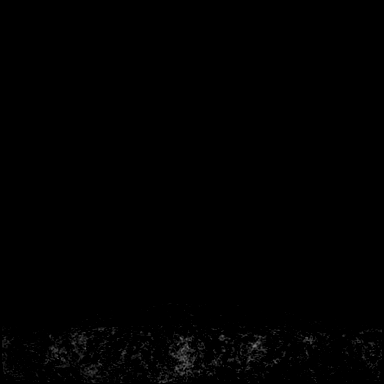
[im 29/144]
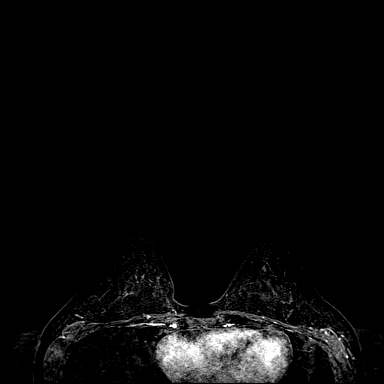
[im 58/144]
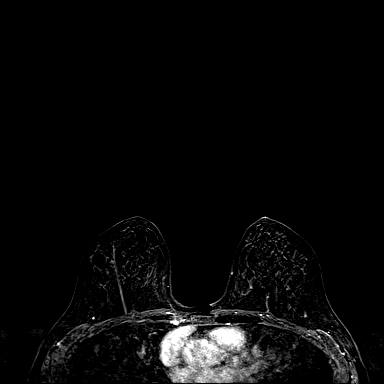
[im 86/144]
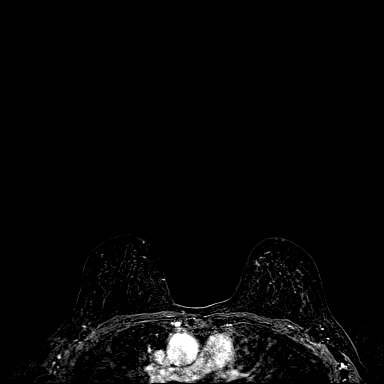
[im 115/144]
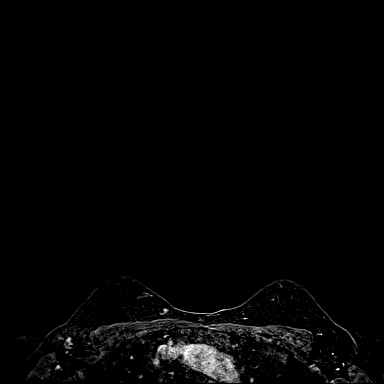

[33 of 48 positions shown; findings below may reference images not displayed]

Three-dimensional MR images were rendered by post-processing of the
original MR data on an independent workstation. The
three-dimensional MR images were interpreted, and findings are
reported in the following complete MRI report for this study. Three
dimensional images were evaluated at the independent interpreting
workstation using the DynaCAD thin client.
FINDINGS: Breast composition: c. Heterogeneous fibroglandular tissue.

Background parenchymal enhancement: Moderate.

Right breast: No suspicious enhancing mass, non-mass enhancement or
secondary signs of malignancy.

Left breast: No suspicious enhancing mass, non-mass enhancement or
secondary signs of malignancy.

Lymph nodes: No abnormal appearing lymph nodes.

Ancillary findings:  None.
IMPRESSION: No MRI evidence of malignancy within either breast.

RECOMMENDATION:
1. Annual screening mammograms.
2. Given patient's family history of breast cancer, would consider
the addition of routine annual screening breast MRI to annual
screening mammography. Per American Cancer Society guidelines,
annual screening MRI of the breasts is recommended if a risk
assessment calculation for breast cancer, preferably using the
Tyrer-Cuzick or Gail model, measures greater than 20%.

BI-RADS CATEGORY  1: Negative.

## 2020-10-15 MED ORDER — GADOBUTROL 1 MMOL/ML IV SOLN
7.0000 mL | Freq: Once | INTRAVENOUS | Status: AC | PRN
  Administered 2020-10-15: 7 mL via INTRAVENOUS

## 2020-11-24 ENCOUNTER — Ambulatory Visit: Payer: 59 | Attending: Student | Admitting: Rehabilitative and Restorative Service Providers"

## 2020-11-24 ENCOUNTER — Encounter: Payer: Self-pay | Admitting: Rehabilitative and Restorative Service Providers"

## 2020-11-24 ENCOUNTER — Other Ambulatory Visit: Payer: Self-pay

## 2020-11-24 DIAGNOSIS — M25674 Stiffness of right foot, not elsewhere classified: Secondary | ICD-10-CM | POA: Insufficient documentation

## 2020-11-24 DIAGNOSIS — M79675 Pain in left toe(s): Secondary | ICD-10-CM | POA: Insufficient documentation

## 2020-11-24 DIAGNOSIS — M25675 Stiffness of left foot, not elsewhere classified: Secondary | ICD-10-CM | POA: Diagnosis present

## 2020-11-24 DIAGNOSIS — M79674 Pain in right toe(s): Secondary | ICD-10-CM | POA: Insufficient documentation

## 2020-11-24 DIAGNOSIS — M2560 Stiffness of unspecified joint, not elsewhere classified: Secondary | ICD-10-CM | POA: Diagnosis present

## 2020-11-24 DIAGNOSIS — R531 Weakness: Secondary | ICD-10-CM | POA: Insufficient documentation

## 2020-11-24 DIAGNOSIS — R600 Localized edema: Secondary | ICD-10-CM | POA: Diagnosis present

## 2020-11-24 DIAGNOSIS — Z9889 Other specified postprocedural states: Secondary | ICD-10-CM | POA: Insufficient documentation

## 2020-11-24 NOTE — Therapy (Signed)
[facility] [address] [place], [place], [address] Phone: [phone]   Fax:  [phone]  Physical Therapy Evaluation  Patient Details  Name: Debbie Glover MRN: [id number] Date of Birth: May 16, 1966 Referring Provider (PT): Florentina Addison [place], [place]   Encounter Date: 11/24/2020   PT End of Session - 11/24/20 0945    Visit Number 1    Date for PT Re-Evaluation 02/16/21    Authorization Type UHC    PT Start Time [phone]    PT Stop Time 0930    PT Time Calculation (min) 52 min    Activity Tolerance Patient tolerated treatment well;No increased pain    Behavior During Therapy WFL for tasks assessed/performed           Past Medical History:  Diagnosis Date  . Chicken pox   . Mitral valve prolapse     Past Surgical History:  Procedure Laterality Date  . ABDOMINAL HYSTERECTOMY     just uterus  . BUNIONECTOMY  05/11/2020  . GANGLION CYST EXCISION  1998  . WISDOM TOOTH EXTRACTION  1989    There were no vitals filed for this visit.    Subjective Assessment - 11/24/20 0841    Subjective Pt reports having both bunions repaired on bilateral feet, R was performed on 05/11/20 and L performed on 09/07/20.  Reports that she has not had as much success with recovery following the rehab of the L bunionectomy.  Pt reports that she cannot go up on her tip toes, jump, or perform certain yoga positions.  Pt is unable to wear certain shoes due to her ROM limitations and pain.  She decided that she needed more aggressive therapy than what she could to on her own, so she and her MD opted for her to begin formal PT.    Limitations Other (comment);Walking   planks, going up on tip toes   How long can you sit comfortably? no impairments    How long can you stand comfortably? can perform as long as needed, but has increased foot swelling    How long can you walk comfortably? Can walk multiple miles, but feels that she may be changing her gait pattern     Patient Stated Goals I want to be able to plank and return to yoga, go up on tip toes without pain.    Currently in Pain? Yes    Pain Score 7     Pain Location Toe (Comment which one)   great toe   Pain Orientation Right;Left    Pain Descriptors / Indicators Sharp    Pain Type Surgical pain    Pain Onset More than a month ago    Pain Frequency Intermittent    Aggravating Factors  going on tip toes    Pain Relieving Factors not being on tip toes    Effect of Pain on Daily Activities has to alter certain movements to avoid do extension    Multiple Pain Sites No              [facility] PT Assessment - 11/24/20 0001      Assessment   Medical Diagnosis s/p bunionectomy bilateral LE    Referring Provider (PT) Jacinta Shoe, PA-c    Onset Date/Surgical Date 09/07/20    Hand Dominance Right    Next MD Visit states surgeon has cleared her only to come back as needed      Restrictions   Weight Bearing Restrictions No  Balance Screen   Has the patient fallen in the past 6 months No    Has the patient had a decrease in activity level because of a fear of falling?  No    Is the patient reluctant to leave their home because of a fear of falling?  No      Home Environment   Living Environment Private residence    Living Arrangements Spouse/significant other    Available Help at Discharge Family    Type of Home House    Home Access Stairs to enter    Home Layout Multi-level;Bed/bath upstairs    Home Equipment None      Prior Function   Level of Independence Independent    Vocation Full time employment    Vocation Requirements Performs site visits at medical clinics, driving, walking, also does some work from home with [employer] and computer work.    Leisure Yoga, walking, socializing with friends      Cognition   Overall Cognitive Status Within Functional Limits for tasks assessed    Attention Focused    Memory Appears intact    Awareness Appears intact    Problem  Solving Appears intact      Observation/Other Assessments   Skin Integrity bilateral incisions healed, no s/s of infection      Observation/Other Assessments-Edema    Edema Figure 8   L:  49 cm, R: 48 cm     Sensation   Light Touch Appears Intact      ROM / Strength   AROM / PROM / Strength AROM   L great toe flexion of 35, L great toe extension of 14 degrees.  R great goe flexion of 45, R great toe extension of 20 degrees     AROM   Overall AROM  Deficits   bilateral great toes, otherwise WNL     Balance   Balance Assessed Yes      Static Standing Balance   Static Standing Balance -  Activities  Single Leg Stance - Right Leg;Single Leg Stance - Left Leg   More difficulty with SLS on L side compared to R side     Functional Gait  Assessment   Gait assessed  Yes    Gait Level Surface Walks 20 ft in less than 5.5 sec, no assistive devices, good speed, no evidence for imbalance, normal gait pattern, deviates no more than 6 in outside of the 12 in walkway width.    Change in Gait Speed Able to smoothly change walking speed without loss of balance or gait deviation. Deviate no more than 6 in outside of the 12 in walkway width.    Gait with Horizontal Head Turns Performs head turns smoothly with no change in gait. Deviates no more than 6 in outside 12 in walkway width    Gait with Vertical Head Turns Performs head turns with no change in gait. Deviates no more than 6 in outside 12 in walkway width.    Gait and Pivot Turn Pivot turns safely within 3 sec and stops quickly with no loss of balance.    Step Over Obstacle Is able to step over one shoe box (4.5 in total height) without changing gait speed. No evidence of imbalance.    Gait with Narrow Base of Support Ambulates 7-9 steps.    Gait with Eyes Closed Walks 20 ft, slow speed, abnormal gait pattern, evidence for imbalance, deviates 10-15 in outside 12 in walkway width. Requires more than 9  sec to ambulate 20 ft.    Ambulating Backwards  Walks 20 ft, no assistive devices, good speed, no evidence for imbalance, normal gait    Steps Alternating feet, must use rail.    Total Score 25                      Objective measurements completed on examination: See above findings.               PT Education - 11/24/20 0917    Education Details Access Code: [email or web address]    Person(s) Educated Patient    Methods Explanation;Demonstration;Handout    Comprehension Verbalized understanding;Returned demonstration            PT Short Term Goals - 11/24/20 0958      PT SHORT TERM GOAL #1   Title Pt wil be independent with HEP    Baseline Provided with handout    Time 2    Period Weeks    Status New             PT Long Term Goals - 11/24/20 0959      PT LONG TERM GOAL #1   Title Patient wil be independent with advanced HEP.    Time 4    Period Weeks    Status New      PT LONG TERM GOAL #2   Title Increase hallux ROM extension to 50 degrees to allow for ability to return to yoga.    Baseline L extension of 14 degree, R extension of 20 degrees    Time 4    Period Weeks      PT LONG TERM GOAL #3   Title Increase bilateral hallux strength to at least 4/5 to allow pt to have proper toe off during ambulation.    Baseline 313minus/5 in L, 3/5 in R    Time 4    Period Weeks    Status New      PT LONG TERM GOAL #4   Title Patient to perform unilateral SLS of at least 30 seconds on each foot to allow her to return to yoga.    Time 4    Period Weeks    Status New      PT LONG TERM GOAL #5   Title Pt able to go up on her tip toes and crouch down to pick up objects from high and low surfaces.    Time 4    Period Weeks    Status New                  Plan - 11/24/20 0947    Clinical Impression Statement Patient referred to outpatient PT following L bunionectomy surgery on 09/07/20.  She reports that she had her R bunionectomy performed in October 2021, and was doing well following that rehab,  but has noticed increased edema and difficulty returning to all tasks following L bunionectomy.  She presents with decreased great toe range of motion and difficulty performing functional tasks such as single leg stance, heel raises, and has been unable to return to yoga.  Pt has toe weakness, and is unable to effectively grip towel with her great toe.  She states that she has been ambulating her prior distance, but does admit to noticing that she is alering her foot placement and gait pattern as a result.  Pt presents to therapy in flip flops, and states that she has been wearing these or crocs primarily due  to increased edema and MD recommendations to wear shoes that allowed for increased toe box.  Educated pt on use of sneakers with long distance ambulation and toe loosen the laces if needed to provide more room secondary to edema.  She would benefit from skilled PT to address her functional impairments and allow her to return to her active lifestyle and yoga.    Personal Factors and Comorbidities Time since onset of injury/illness/exacerbation    Examination-Activity Limitations Locomotion Level;Bend;Squat;Stairs;Reach Overhead    Examination-Participation Restrictions Yard Work;Community Activity    Stability/Clinical Decision Making Stable/Uncomplicated    Clinical Decision Making Low    Rehab Potential Excellent    PT Frequency 2x / week    PT Duration 4 weeks    PT Treatment/Interventions ADLs/Self Care Home Management;Cryotherapy;Moist Heat;Gait training;Stair training;Functional mobility training;Therapeutic activities;Therapeutic exercise;Balance training;Neuromuscular re-education;Patient/family education;Manual techniques;Scar mobilization;Passive range of motion;Dry needling;Taping;Vasopneumatic Device;Joint Manipulations    PT Next Visit Plan Review HEP, progress ROM    PT Home Exercise Plan provided with email, text, and handout of [employer] HEP    Consulted and Agree with Plan of Care  Patient           Patient will benefit from skilled therapeutic intervention in order to improve the following deficits and impairments:  Abnormal gait,Decreased range of motion,Increased fascial restricitons,Pain,Decreased balance,Decreased scar mobility,Impaired flexibility,Decreased strength,Increased edema  Visit Diagnosis: Pain in left toe(s) - Plan: PT plan of care cert/re-cert  Decreased ROM of left foot - Plan: PT plan of care cert/re-cert  Pain in right toe(s) - Plan: PT plan of care cert/re-cert  History of bunionectomy of both great toes - Plan: PT plan of care cert/re-cert  Localized edema - Plan: PT plan of care cert/re-cert  Decreased range of motion with decreased strength - Plan: PT plan of care cert/re-cert  Decreased range of motion of right foot - Plan: PT plan of care cert/re-cert     Problem List There are no problems to display for this patient.   Reather Laurence, PT, DPT 11/24/2020, 10:24 AM  [facility] [address] [place], [place], [address] Phone: [phone]   Fax:  [phone]  Name: Debbie Glover MRN: [id number] Date of Birth: 10/24/1965

## 2020-11-24 NOTE — Patient Instructions (Signed)
Access Code: [email or web address] URL: [email or web address] Date: 11/24/2020 Prepared by: Clydie Braun Yonael Tulloch  Exercises Towel Scrunches - 1 x daily - 7 x weekly - 3 sets - 10 reps - 1 hold Seated Self Great Toe Stretch - 2 x daily - 7 x weekly - 1 sets - 10 reps - 5-10 hold Seated Marble Pick-Up with Toes - 1 x daily - 7 x weekly - 1 sets - 10 reps Toe Raises with Counter Support - 1-2 x daily - 7 x weekly - 1-2 sets - 10 reps Heel Toe Raises with Unilateral Counter Support - 1-2 x daily - 7 x weekly - 1-2 sets - 10 reps

## 2020-12-01 ENCOUNTER — Ambulatory Visit: Payer: 59 | Attending: Student | Admitting: Physical Therapy

## 2020-12-01 ENCOUNTER — Other Ambulatory Visit: Payer: Self-pay

## 2020-12-01 DIAGNOSIS — Z9889 Other specified postprocedural states: Secondary | ICD-10-CM | POA: Insufficient documentation

## 2020-12-01 DIAGNOSIS — R600 Localized edema: Secondary | ICD-10-CM | POA: Insufficient documentation

## 2020-12-01 DIAGNOSIS — R531 Weakness: Secondary | ICD-10-CM | POA: Insufficient documentation

## 2020-12-01 DIAGNOSIS — M79675 Pain in left toe(s): Secondary | ICD-10-CM | POA: Insufficient documentation

## 2020-12-01 DIAGNOSIS — M2560 Stiffness of unspecified joint, not elsewhere classified: Secondary | ICD-10-CM | POA: Diagnosis present

## 2020-12-01 DIAGNOSIS — M25675 Stiffness of left foot, not elsewhere classified: Secondary | ICD-10-CM | POA: Diagnosis present

## 2020-12-01 NOTE — Therapy (Signed)
[facility] [address] [place], [place], [address] Phone: [phone]   Fax:  [phone]  Physical Therapy Treatment  Patient Details  Name: Debbie Glover MRN: [id number] Date of Birth: Apr 06, 1966 Referring Provider (PT): Florentina Addison [place], [place]   Encounter Date: 12/01/2020   PT End of Session - 12/01/20 1655    Visit Number 2    Date for PT Re-Evaluation 02/16/21    PT Start Time 1600    PT Stop Time 1642    PT Time Calculation (min) 42 min    Activity Tolerance Patient tolerated treatment well;No increased pain    Behavior During Therapy WFL for tasks assessed/performed           Past Medical History:  Diagnosis Date  . Chicken pox   . Mitral valve prolapse     Past Surgical History:  Procedure Laterality Date  . ABDOMINAL HYSTERECTOMY     just uterus  . BUNIONECTOMY  05/11/2020  . GANGLION CYST EXCISION  1998  . WISDOM TOOTH EXTRACTION  1989    There were no vitals filed for this visit.   Subjective Assessment - 12/01/20 1648    Subjective Pt stated that she was not having any pain at rest, but only when she tried to go up on her toes or do certain yoga poses. She stated that she tried to wear shoes per Aletta Edouard suggestion and it caused too much discomfort. Having some nerve discomfort in 2nd toe on LLE but does not know if it related to previous surgery or titrating off gabapentin.    Currently in Pain? No/denies                             [facility] Adult PT Treatment/Exercise - 12/01/20 0001      Exercises   Exercises Ankle      Manual Therapy   Manual Therapy Passive ROM    Passive ROM great toe PIP, DIP      Ankle Exercises: Aerobic   Recumbent Bike L2 x      Ankle Exercises: Standing   Heel Raises Both;10 reps   at black bar   Toe Raise 10 reps      Ankle Exercises: Seated   Towel Crunch 2 reps   60 secs each   Toe Raise 20 reps    BAPS Sitting;15 reps   both LE    Other Seated Ankle Exercises marble pick up x20    Other Seated Ankle Exercises DF/PF red tband 2x10, STS on airex x10, SLS both on airex, alternating marching on airex x20                    PT Short Term Goals - 11/24/20 7124      PT SHORT TERM GOAL #1   Title Pt wil be independent with HEP    Baseline Provided with handout    Time 2    Period Weeks    Status New             PT Long Term Goals - 11/24/20 5809      PT LONG TERM GOAL #1   Title Patient wil be independent with advanced HEP.    Time 4    Period Weeks    Status New      PT LONG TERM GOAL #2   Title Increase hallux ROM extension to 50 degrees to allow  for ability to return to yoga.    Baseline L extension of 14 degree, R extension of 20 degrees    Time 4    Period Weeks      PT LONG TERM GOAL #3   Title Increase bilateral hallux strength to at least 4/5 to allow pt to have proper toe off during ambulation.    Baseline 67minus/5 in L, 3/5 in R    Time 4    Period Weeks    Status New      PT LONG TERM GOAL #4   Title Patient to perform unilateral SLS of at least 30 seconds on each foot to allow her to return to yoga.    Time 4    Period Weeks    Status New      PT LONG TERM GOAL #5   Title Pt able to go up on her tip toes and crouch down to pick up objects from high and low surfaces.    Time 4    Period Weeks    Status New                 Plan - 12/01/20 1650    Clinical Impression Statement Pt tolerated first session well. She continues to be unable to grip towel with great toe but was able to grip pillow case and pick up marbles and then drop them. Decreased ROM noted in L great toe with flexion and extension, with some pain at end range PROM of DIP. Discussed bringing sneakers to next session to try.    PT Treatment/Interventions ADLs/Self Care Home Management;Cryotherapy;Moist Heat;Gait training;Stair training;Functional mobility training;Therapeutic activities;Therapeutic  exercise;Balance training;Neuromuscular re-education;Patient/family education;Manual techniques;Scar mobilization;Passive range of motion;Dry needling;Taping;Vasopneumatic Device;Joint Manipulations    PT Next Visit Plan continue to progress ROM and strength           Patient will benefit from skilled therapeutic intervention in order to improve the following deficits and impairments:  Abnormal gait,Decreased range of motion,Increased fascial restricitons,Pain,Decreased balance,Decreased scar mobility,Impaired flexibility,Decreased strength,Increased edema  Visit Diagnosis: Decreased ROM of left foot  Pain in left toe(s)  Localized edema  History of bunionectomy of both great toes  Decreased range of motion with decreased strength     Problem List There are no problems to display for this patient.   Alma Friendly, Leda Gauze 12/01/2020, 4:56 PM  [facility] [address] [place], [place], [address] Phone: [phone]   Fax:  [phone]  Name: Debbie Glover MRN: [id number] Date of Birth: 08-31-1965

## 2020-12-07 ENCOUNTER — Encounter: Payer: Self-pay | Admitting: Physical Therapy

## 2020-12-07 ENCOUNTER — Ambulatory Visit: Payer: 59 | Admitting: Physical Therapy

## 2020-12-07 ENCOUNTER — Other Ambulatory Visit: Payer: Self-pay

## 2020-12-07 DIAGNOSIS — M79675 Pain in left toe(s): Secondary | ICD-10-CM

## 2020-12-07 DIAGNOSIS — M25675 Stiffness of left foot, not elsewhere classified: Secondary | ICD-10-CM | POA: Diagnosis not present

## 2020-12-07 DIAGNOSIS — R600 Localized edema: Secondary | ICD-10-CM

## 2020-12-07 NOTE — Therapy (Signed)
[facility] [address] [place], [place], [address] Phone: [phone]   Fax:  [phone]  Physical Therapy Treatment  Patient Details  Name: Debbie Glover MRN: [id number] Date of Birth: September 05, 1965 Referring Provider (PT): Florentina Addison [place], [place]   Encounter Date: 12/07/2020   PT End of Session - 12/07/20 1556    Visit Number 3    Date for PT Re-Evaluation 02/16/21    Authorization Type UHC    PT Start Time 1515    PT Stop Time 1556    PT Time Calculation (min) 41 min    Activity Tolerance Patient tolerated treatment well;No increased pain    Behavior During Therapy WFL for tasks assessed/performed           Past Medical History:  Diagnosis Date  . Chicken pox   . Mitral valve prolapse     Past Surgical History:  Procedure Laterality Date  . ABDOMINAL HYSTERECTOMY     just uterus  . BUNIONECTOMY  05/11/2020  . GANGLION CYST EXCISION  1998  . WISDOM TOOTH EXTRACTION  1989    There were no vitals filed for this visit.   Subjective Assessment - 12/07/20 1519    Subjective Toe is going ok, it is still tight when standing up on tip toes    Currently in Pain? No/denies                             [facility] Adult PT Treatment/Exercise - 12/07/20 0001      Exercises   Exercises Ankle      Manual Therapy   Manual Therapy Passive ROM    Passive ROM bilaterally great toe PIP, DIP      Ankle Exercises: Aerobic   Recumbent Bike L2 x      Ankle Exercises: Standing   Heel Raises Both;15 reps;2 seconds   x2   Toe Raise 2 seconds;15 reps   x2   Other Standing Ankle Exercises Sit to stand LE on dyna disk 2x10    Other Standing Ankle Exercises Toe and Heel walking      Ankle Exercises: Seated   Other Seated Ankle Exercises Great toes flex and Ext with manual resistance    Other Seated Ankle Exercises Feet on dyna disk flexing  toes down 2x15      Ankle Exercises: Supine   Other Supine Ankle  Exercises Downward dog with pedals for toe Ext stretch    Other Supine Ankle Exercises [place] 2x5, Prone plank 45''                    PT Short Term Goals - 11/24/20 8889      PT SHORT TERM GOAL #1   Title Pt wil be independent with HEP    Baseline Provided with handout    Time 2    Period Weeks    Status New             PT Long Term Goals - 11/24/20 1694      PT LONG TERM GOAL #1   Title Patient wil be independent with advanced HEP.    Time 4    Period Weeks    Status New      PT LONG TERM GOAL #2   Title Increase hallux ROM extension to 50 degrees to allow for ability to return to yoga.    Baseline L extension of 14 degree,  R extension of 20 degrees    Time 4    Period Weeks      PT LONG TERM GOAL #3   Title Increase bilateral hallux strength to at least 4/5 to allow pt to have proper toe off during ambulation.    Baseline 28minus/5 in L, 3/5 in R    Time 4    Period Weeks    Status New      PT LONG TERM GOAL #4   Title Patient to perform unilateral SLS of at least 30 seconds on each foot to allow her to return to yoga.    Time 4    Period Weeks    Status New      PT LONG TERM GOAL #5   Title Pt able to go up on her tip toes and crouch down to pick up objects from high and low surfaces.    Time 4    Period Weeks    Status New                 Plan - 12/07/20 1557    Clinical Impression Statement Pt did well with a progressed session. She use to attend yoga but bas not returned. T=Yoga poses incorporated that forced great toe extension, pain reported but able to tolerate. Some instability with sit to stand on Dyna disk. Some pain and difficulty reported with heel walking.    Personal Factors and Comorbidities Time since onset of injury/illness/exacerbation    Examination-Activity Limitations Locomotion Level;Bend;Squat;Stairs;Reach Overhead    Examination-Participation Restrictions Yard Work;Community Activity    Stability/Clinical  Decision Making Stable/Uncomplicated    Rehab Potential Excellent    PT Frequency 2x / week    PT Treatment/Interventions ADLs/Self Care Home Management;Cryotherapy;Moist Heat;Gait training;Stair training;Functional mobility training;Therapeutic activities;Therapeutic exercise;Balance training;Neuromuscular re-education;Patient/family education;Manual techniques;Scar mobilization;Passive range of motion;Dry needling;Taping;Vasopneumatic Device;Joint Manipulations    PT Next Visit Plan continue to progress ROM and strength           Patient will benefit from skilled therapeutic intervention in order to improve the following deficits and impairments:  Abnormal gait,Decreased range of motion,Increased fascial restricitons,Pain,Decreased balance,Decreased scar mobility,Impaired flexibility,Decreased strength,Increased edema  Visit Diagnosis: Decreased ROM of left foot  Localized edema  Pain in left toe(s)     Problem List There are no problems to display for this patient.   Grayce Sessions 12/07/2020, 3:59 PM  [facility] [address] [place], [place], [address] Phone: [phone]   Fax:  [phone]  Name: Avalon Coppinger MRN: [id number] Date of Birth: [birth year]

## 2020-12-09 ENCOUNTER — Other Ambulatory Visit: Payer: Self-pay

## 2020-12-09 ENCOUNTER — Ambulatory Visit: Payer: 59 | Admitting: Physical Therapy

## 2020-12-09 ENCOUNTER — Encounter: Payer: Self-pay | Admitting: Physical Therapy

## 2020-12-09 DIAGNOSIS — M79675 Pain in left toe(s): Secondary | ICD-10-CM

## 2020-12-09 DIAGNOSIS — M25675 Stiffness of left foot, not elsewhere classified: Secondary | ICD-10-CM

## 2020-12-09 DIAGNOSIS — Z9889 Other specified postprocedural states: Secondary | ICD-10-CM

## 2020-12-09 DIAGNOSIS — R600 Localized edema: Secondary | ICD-10-CM

## 2020-12-09 NOTE — Therapy (Signed)
[facility] [address]. [place], [place], [address] Phone: [phone]   Fax:  [phone]  Physical Therapy Treatment  Patient Details  Name: Debbie Glover MRN: [id number] Date of Birth: 1966/04/03 Referring Provider (PT): Florentina Addison [place], [place]   Encounter Date: 12/09/2020   PT End of Session - 12/09/20 1555    Visit Number 4    Date for PT Re-Evaluation 02/16/21    Authorization Type UHC    PT Start Time 1515    PT Stop Time 1600    PT Time Calculation (min) 45 min    Activity Tolerance Patient tolerated treatment well    Behavior During Therapy [facility] for tasks assessed/performed           Past Medical History:  Diagnosis Date  . Chicken pox   . Mitral valve prolapse     Past Surgical History:  Procedure Laterality Date  . ABDOMINAL HYSTERECTOMY     just uterus  . BUNIONECTOMY  05/11/2020  . GANGLION CYST EXCISION  1998  . WISDOM TOOTH EXTRACTION  1989    There were no vitals filed for this visit.   Subjective Assessment - 12/09/20 1515    Subjective "Im good, Im sore" did some yoga yesterday    Currently in Pain? No/denies                             [facility] Adult PT Treatment/Exercise - 12/09/20 0001      High Level Balance   High Level Balance Activities Tandem walking   on balace beam   High Level Balance Comments SLS ball toss, SLS on airex ball toss      Ankle Exercises: Aerobic   Elliptical L1/7 x2 min each    Recumbent Bike L2.5 x 4 mins      Ankle Exercises: Standing   Other Standing Ankle Exercises Sit to stand LE on dyna disk 2x10, Toe and hee raises on black bar 2x15    Other Standing Ankle Exercises Toe and Heel walking; BOSU squats 2x10      Ankle Exercises: Supine   Other Supine Ankle Exercises Downward dog with pedals for toe Ext stretch      Ankle Exercises: Machines for Strengthening   Cybex Leg Press 40lb 2x10 LEon dyna disk 2x10, Heel raises 40lb 2x10                     PT Short Term Goals - 12/09/20 1559      PT SHORT TERM GOAL #1   Title Pt wil be independent with HEP    Status Achieved             PT Long Term Goals - 12/09/20 1559      PT LONG TERM GOAL #1   Title Patient wil be independent with advanced HEP.    Status Partially Met      PT LONG TERM GOAL #2   Title Increase hallux ROM extension to 50 degrees to allow for ability to return to yoga.    Status On-going                 Plan - 12/09/20 1556    Clinical Impression Statement Pt reports improvement overall reporting better function and less pain. She did well with the interventions. she did have some instability with SLS on airex and BOSU squats. Toe pain remains with toe walking.  Personal Factors and Comorbidities Time since onset of injury/illness/exacerbation    Examination-Activity Limitations Locomotion Level;Bend;Squat;Stairs;Reach Overhead    Examination-Participation Restrictions Yard Work;Community Activity    Stability/Clinical Decision Making Stable/Uncomplicated    Rehab Potential Excellent    PT Frequency 2x / week    PT Duration 4 weeks    PT Treatment/Interventions ADLs/Self Care Home Management;Cryotherapy;Moist Heat;Gait training;Stair training;Functional mobility training;Therapeutic activities;Therapeutic exercise;Balance training;Neuromuscular re-education;Patient/family education;Manual techniques;Scar mobilization;Passive range of motion;Dry needling;Taping;Vasopneumatic Device;Joint Manipulations    PT Next Visit Plan continue to progress ROM and strength           Patient will benefit from skilled therapeutic intervention in order to improve the following deficits and impairments:  Abnormal gait,Decreased range of motion,Increased fascial restricitons,Pain,Decreased balance,Decreased scar mobility,Impaired flexibility,Decreased strength,Increased edema  Visit Diagnosis: Pain in left toe(s)  Localized  edema  Decreased ROM of left foot  History of bunionectomy of both great toes     Problem List There are no problems to display for this patient.   Scot Jun, PTA 12/09/2020, 3:59 PM  [place] [place], [place], [address] Phone: [phone]   Fax:  [phone]  Name: Gloriajean Okun MRN: [id number] Date of Birth: [birth year]

## 2020-12-14 ENCOUNTER — Other Ambulatory Visit: Payer: Self-pay

## 2020-12-14 ENCOUNTER — Encounter: Payer: Self-pay | Admitting: Physical Therapy

## 2020-12-14 ENCOUNTER — Ambulatory Visit: Payer: 59 | Admitting: Physical Therapy

## 2020-12-14 DIAGNOSIS — R600 Localized edema: Secondary | ICD-10-CM

## 2020-12-14 DIAGNOSIS — M79675 Pain in left toe(s): Secondary | ICD-10-CM

## 2020-12-14 DIAGNOSIS — M25675 Stiffness of left foot, not elsewhere classified: Secondary | ICD-10-CM | POA: Diagnosis not present

## 2020-12-14 NOTE — Therapy (Signed)
[place] [place], [place], [address] Phone: [phone]   Fax:  [phone]  Physical Therapy Treatment  Patient Details  Name: Debbie Glover MRN: [id number] Date of Birth: 07-25-1966 Referring Provider (PT): Marya Amsler [place], [place]   Encounter Date: 12/14/2020   PT End of Session - 12/14/20 1558    Visit Number 5    Date for PT Re-Evaluation 02/16/21    PT Start Time 0370    PT Stop Time 1559    PT Time Calculation (min) 44 min    Activity Tolerance Patient tolerated treatment well    Behavior During Therapy [facility] for tasks assessed/performed           Past Medical History:  Diagnosis Date  . Chicken pox   . Mitral valve prolapse     Past Surgical History:  Procedure Laterality Date  . ABDOMINAL HYSTERECTOMY     just uterus  . BUNIONECTOMY  05/11/2020  . GANGLION CYST EXCISION  1998  . WISDOM TOOTH EXTRACTION  1989    There were no vitals filed for this visit.   Subjective Assessment - 12/14/20 1517    Subjective "Tired but good"    Currently in Pain? No/denies                             [facility] Adult PT Treatment/Exercise - 12/14/20 0001      Manual Therapy   Manual Therapy Passive ROM    Passive ROM bilaterally great toe PIP, DIP      Ankle Exercises: Aerobic   Elliptical L2.3 x2 min each    Recumbent Bike L2.3 x 2 min 85RPM      Ankle Exercises: Standing   Other Standing Ankle Exercises Warrier 3 x5 each, Tree pose x 3 each    Other Standing Ankle Exercises Heel and toe raises black bar 2x10      Ankle Exercises: Machines for Strengthening   Cybex Leg Press 40lb 2x15 LE on dyna disk 2x10, Heel raises 40lb 2x15      Ankle Exercises: Plyometrics   Plyometric Exercises Mini Trampolin ejumps 3 way 2x15      Ankle Exercises: Supine   Other Supine Ankle Exercises Table to Downward dof transition x5    Other Supine Ankle Exercises Prone plank 3x10 sec core strenght  anf toe Ext                    PT Short Term Goals - 12/09/20 1559      PT SHORT TERM GOAL #1   Title Pt wil be independent with HEP    Status Achieved             PT Long Term Goals - 12/09/20 1559      PT LONG TERM GOAL #1   Title Patient wil be independent with advanced HEP.    Status Partially Met      PT LONG TERM GOAL #2   Title Increase hallux ROM extension to 50 degrees to allow for ability to return to yoga.    Status On-going                 Plan - 12/14/20 1559    Clinical Impression Statement Pt did well overall today, incorporated some yoga moved for SLS balance. Added table to downward dog transition toe stretching and mobility was difficulty for patient. Prone planks also utilized to  focus on toe extension. . Pain reported today with MT at end range.    Personal Factors and Comorbidities Time since onset of injury/illness/exacerbation    Examination-Activity Limitations Locomotion Level;Bend;Squat;Stairs;Reach Overhead    Examination-Participation Restrictions Yard Work;Community Activity    Stability/Clinical Decision Making Stable/Uncomplicated    Rehab Potential Excellent    PT Frequency 2x / week    PT Duration 4 weeks    PT Treatment/Interventions ADLs/Self Care Home Management;Cryotherapy;Moist Heat;Gait training;Stair training;Functional mobility training;Therapeutic activities;Therapeutic exercise;Balance training;Neuromuscular re-education;Patient/family education;Manual techniques;Scar mobilization;Passive range of motion;Dry needling;Taping;Vasopneumatic Device;Joint Manipulations    PT Next Visit Plan continue to progress ROM and strength           Patient will benefit from skilled therapeutic intervention in order to improve the following deficits and impairments:  Abnormal gait,Decreased range of motion,Increased fascial restricitons,Pain,Decreased balance,Decreased scar mobility,Impaired flexibility,Decreased strength,Increased  edema  Visit Diagnosis: Pain in left toe(s)  Localized edema  Decreased ROM of left foot     Problem List There are no problems to display for this patient.   Scot Jun, PTA 12/14/2020, 4:06 PM  [place] [place]. [place], [place], [address] Phone: [phone]   Fax:  [phone]  Name: Debbie Glover MRN: [id number] Date of Birth: Aug 17, 1965

## 2020-12-16 ENCOUNTER — Ambulatory Visit: Payer: 59 | Admitting: Physical Therapy

## 2020-12-16 ENCOUNTER — Encounter: Payer: Self-pay | Admitting: Physical Therapy

## 2020-12-16 ENCOUNTER — Other Ambulatory Visit: Payer: Self-pay

## 2020-12-16 DIAGNOSIS — M79675 Pain in left toe(s): Secondary | ICD-10-CM

## 2020-12-16 DIAGNOSIS — M25675 Stiffness of left foot, not elsewhere classified: Secondary | ICD-10-CM

## 2020-12-16 DIAGNOSIS — R600 Localized edema: Secondary | ICD-10-CM

## 2020-12-16 DIAGNOSIS — Z9889 Other specified postprocedural states: Secondary | ICD-10-CM

## 2020-12-16 NOTE — Therapy (Signed)
[place] [place], [place], [address] Phone: [phone]   Fax:  [phone]  Physical Therapy Treatment  Patient Details  Name: Debbie Glover MRN: [id number] Date of Birth: Feb 11, 1966 Referring Provider (PT): Marya Amsler [place], [place]   Encounter Date: 12/16/2020   PT End of Session - 12/16/20 1557    Visit Number 6    Date for PT Re-Evaluation 02/16/21    Authorization Type UHC    PT Start Time 2952    PT Stop Time 1558    PT Time Calculation (min) 43 min    Activity Tolerance Patient tolerated treatment well    Behavior During Therapy [facility] for tasks assessed/performed           Past Medical History:  Diagnosis Date  . Chicken pox   . Mitral valve prolapse     Past Surgical History:  Procedure Laterality Date  . ABDOMINAL HYSTERECTOMY     just uterus  . BUNIONECTOMY  05/11/2020  . GANGLION CYST EXCISION  1998  . WISDOM TOOTH EXTRACTION  1989    There were no vitals filed for this visit.   Subjective Assessment - 12/16/20 1517    Subjective Doing good, no pain                             [facility] Adult PT Treatment/Exercise - 12/16/20 0001      Neuro Re-ed    Neuro Re-ed Details  On BOSU ball toss x 15 on both sides, SLS on round airex with ball toss 2x10      Ankle Exercises: Aerobic   Elliptical L2.3 x2 min each    Tread Mill 3.3MPH 1.5% inclin x4 min      Ankle Exercises: Plyometrics   Plyometric Exercises Walking lunges    Plyometric Exercises Tmill pushed 3x30 steps      Ankle Exercises: Machines for Strengthening   Cybex Leg Press 50lb 2x15 in DF with heel raise      Ankle Exercises: Standing   Other Standing Ankle Exercises Step ups on mat table 2x5    Other Standing Ankle Exercises Resisted gait Forward and backwards x 5 each                    PT Short Term Goals - 12/09/20 1559      PT SHORT TERM GOAL #1   Title Pt wil be independent with HEP     Status Achieved             PT Long Term Goals - 12/16/20 1558      PT LONG TERM GOAL #1   Title Patient wil be independent with advanced HEP.    Status Achieved      PT LONG TERM GOAL #2   Title Increase hallux ROM extension to 50 degrees to allow for ability to return to yoga.    Status On-going      PT LONG TERM GOAL #3   Title Increase bilateral hallux strength to at least 4/5 to allow pt to have proper toe off during ambulation.    Status On-going      PT LONG TERM GOAL #4   Title Patient to perform unilateral SLS of at least 30 seconds on each foot to allow her to return to yoga.    Status Partially Met      PT LONG TERM GOAL #5  Title Pt able to go up on her tip toes and crouch down to pick up objects from high and low surfaces.    Status On-going                 Plan - 12/16/20 1559    Clinical Impression Statement Pt able to complete all of the interventions. Added speed walking and Tmill pushes without issues. LE did become fatigue with Tmill pushes. Good stability with ball tosses standing on BOSU. Some instability noted with SLS on round airex with ball toss. Some difficulty stepping up on mat table due to LE fatigue.    Personal Factors and Comorbidities Time since onset of injury/illness/exacerbation    Examination-Activity Limitations Locomotion Level;Bend;Squat;Stairs;Reach Overhead    Examination-Participation Restrictions Yard Work;Community Activity    Stability/Clinical Decision Making Stable/Uncomplicated    Rehab Potential Excellent    PT Frequency 2x / week    PT Duration 4 weeks    PT Treatment/Interventions ADLs/Self Care Home Management;Cryotherapy;Moist Heat;Gait training;Stair training;Functional mobility training;Therapeutic activities;Therapeutic exercise;Balance training;Neuromuscular re-education;Patient/family education;Manual techniques;Scar mobilization;Passive range of motion;Dry needling;Taping;Vasopneumatic Device;Joint  Manipulations    PT Next Visit Plan continue to progress ROM and strength           Patient will benefit from skilled therapeutic intervention in order to improve the following deficits and impairments:  Abnormal gait,Decreased range of motion,Increased fascial restricitons,Pain,Decreased balance,Decreased scar mobility,Impaired flexibility,Decreased strength,Increased edema  Visit Diagnosis: Pain in left toe(s)  Localized edema  Decreased ROM of left foot  History of bunionectomy of both great toes     Problem List There are no problems to display for this patient.   Scot Jun, PTA 12/16/2020, 4:07 PM  [place] [address]. [place], [place], [address] Phone: [phone]   Fax:  [phone]  Name: Debbie Glover MRN: [id number] Date of Birth: 06-26-1966

## 2021-05-11 ENCOUNTER — Encounter: Payer: 59 | Admitting: Nurse Practitioner

## 2021-07-13 ENCOUNTER — Other Ambulatory Visit: Payer: Self-pay

## 2021-07-13 ENCOUNTER — Ambulatory Visit (INDEPENDENT_AMBULATORY_CARE_PROVIDER_SITE_OTHER): Payer: 59 | Admitting: Nurse Practitioner

## 2021-07-13 ENCOUNTER — Encounter: Payer: Self-pay | Admitting: Nurse Practitioner

## 2021-07-13 VITALS — BP 122/62 | HR 86 | Temp 97.2°F | Ht 66.75 in | Wt 134.2 lb

## 2021-07-13 DIAGNOSIS — E7800 Pure hypercholesterolemia, unspecified: Secondary | ICD-10-CM | POA: Diagnosis not present

## 2021-07-13 DIAGNOSIS — Z0000 Encounter for general adult medical examination without abnormal findings: Secondary | ICD-10-CM

## 2021-07-13 DIAGNOSIS — Z8249 Family history of ischemic heart disease and other diseases of the circulatory system: Secondary | ICD-10-CM

## 2021-07-13 DIAGNOSIS — E559 Vitamin D deficiency, unspecified: Secondary | ICD-10-CM

## 2021-07-13 DIAGNOSIS — Z0001 Encounter for general adult medical examination with abnormal findings: Secondary | ICD-10-CM

## 2021-07-13 NOTE — Assessment & Plan Note (Addendum)
Her brother recently diagnosed with multiple unprovoked DVTs in bilateral LE. He also had the following abnormal lab results: decreased Protein S activity, elevated lipoprotein A, and positive PAI-1 locus 4G/5G. She was advised to get tested She has no hx of PE/DVT, no tobacco use, no illicit drug use, social use of ETOH.  Ordered PT/PTT, Protein S and C panel, lipo A and PAI-1

## 2021-07-13 NOTE — Addendum Note (Signed)
Addended by: Varney Biles on: 07/13/2021 02:18 PM   Modules accepted: Orders

## 2021-07-13 NOTE — Patient Instructions (Signed)
Schedule lab appt for fasting blood draw. Need to be fasting 8hrs prior to blood draw.  Maintain heart healthy diet and daily exercise (target 17mns per week)  Preventive Care [address]Years Old, Female Preventive care refers to lifestyle choices and visits with your health care provider that can promote health and wellness. Preventive care visits are also called wellness exams. What can I expect for my preventive care visit? Counseling Your health care provider may ask you questions about your: Medical history, including: Past medical problems. Family medical history. Pregnancy history. Current health, including: Menstrual cycle. Method of birth control. Emotional well-being. Home life and relationship well-being. Sexual activity and sexual health. Lifestyle, including: Alcohol, nicotine or tobacco, and drug use. Access to firearms. Diet, exercise, and sleep habits. Work and work [employer] Sun[employer] use. Safety issues such as seatbelt and bike helmet use. Physical exam Your health care provider will check your: Height and weight. These may be used to calculate your BMI (body mass index). BMI is a measurement that tells if you are at a healthy weight. Waist circumference. This measures the distance around your waistline. This measurement also tells if you are at a healthy weight and may help predict your risk of certain diseases, such as type 2 diabetes and high blood pressure. Heart rate and blood pressure. Body temperature. Skin for abnormal spots. What immunizations do I need? Vaccines are usually given at various ages, according to a schedule. Your health care provider will recommend vaccines for you based on your age, medical history, and lifestyle or other factors, such as travel or where you work. What tests do I need? Screening Your health care provider may recommend screening tests for certain conditions. This may include: Lipid and cholesterol levels. Diabetes  screening. This is done by checking your blood sugar (glucose) after you have not eaten for a while (fasting). Pelvic exam and Pap test. Hepatitis B test. Hepatitis C test. HIV (human immunodeficiency virus) test. STI (sexually transmitted infection) testing, if you are at risk. Lung cancer screening. Colorectal cancer screening. Mammogram. Talk with your health care provider about when you should start having regular mammograms. This may depend on whether you have a family history of breast cancer. BRCA-related cancer screening. This may be done if you have a family history of breast, ovarian, tubal, or peritoneal cancers. Bone density scan. This is done to screen for osteoporosis. Talk with your health care provider about your test results, treatment options, and if necessary, the need for more tests. Follow these instructions at home: Eating and drinking  Eat a diet that includes fresh fruits and vegetables, whole grains, lean protein, and low-fat dairy products. Take vitamin and mineral supplements as recommended by your health care provider. Do not drink alcohol if: Your health care provider tells you not to drink. You are pregnant, may be pregnant, or are planning to become pregnant. If you drink alcohol: Limit how much you have to 0-1 drink a day. Know how much alcohol is in your drink. In the [place] one drink equals one 12 oz bottle of beer (355 mL), one 5 oz glass of wine (148 mL), or one 1 oz glass of hard liquor (44 mL). Lifestyle Brush your teeth every morning and night with fluoride toothpaste. Floss one time each day. Exercise for at least 30 minutes 5 or more days each week. Do not use any products that contain nicotine or tobacco. These products include cigarettes, chewing tobacco, and vaping devices, such as e-cigarettes. If you need  help quitting, ask your health care provider. Do not use drugs. If you are sexually active, practice safe sex. Use a condom or other form of  protection to prevent STIs. If you do not wish to become pregnant, use a form of birth control. If you plan to become pregnant, see your health care provider for a prepregnancy visit. Take aspirin only as told by your health care provider. Make sure that you understand how much to take and what form to take. Work with your health care provider to find out whether it is safe and beneficial for you to take aspirin daily. Find healthy ways to manage stress, such as: Meditation, yoga, or listening to music. Journaling. Talking to a trusted person. Spending time with friends and family. Minimize exposure to UV radiation to reduce your risk of skin cancer. Safety Always wear your seat belt while driving or riding in a vehicle. Do not drive: If you have been drinking alcohol. Do not ride with someone who has been drinking. When you are tired or distracted. While texting. If you have been using any mind-altering substances or drugs. Wear a helmet and other protective equipment during sports activities. If you have firearms in your house, make sure you follow all gun safety procedures. Seek help if you have been physically or sexually abused. What's next? Visit your health care provider once a year for an annual wellness visit. Ask your health care provider how often you should have your eyes and teeth checked. Stay up to date on all vaccines. This information is not intended to replace advice given to you by your health care provider. Make sure you discuss any questions you have with your health care provider. Document Revised: 01/12/2021 Document Reviewed: 01/12/2021 Elsevier Patient Education  [place].

## 2021-07-13 NOTE — Progress Notes (Signed)
Subjective:    Patient ID: Debbie Glover, female    DOB: 02/27/1966, 55 y.o.   MRN: [id number]  Patient presents today for CPE and eval of chronic conditions  HPI Family history of blood clots Her brother recently diagnosed with multiple unprovoked DVTs in bilateral LE. He also had the following abnormal lab results: decreased Protein S activity, elevated lipoprotein A, and positive PAI-1 locus 4G/5G. She was advised to get tested She has no hx of PE/DVT, no tobacco use, no illicit drug use, social use of ETOH.  Ordered PT/PTT, Protein S and C panel, lipo A and PAI-1  Vitamin D deficiency Repeat Vit. D  Elevated LDL cholesterol level Repeat lipid panel Advised about the importance of heart healthy diet and daily exercise  At high risk for breast cancer Breast MRI completed annually  Vision:up to date Dental:up to date Diet:regular Exercise:walking per day Weight:  Wt Readings from Last 3 Encounters:  07/13/21 134 lb 3.2 oz (60.9 kg)  08/26/20 136 lb 12.8 oz (62.1 kg)  05/26/20 133 lb 6.4 oz (60.5 kg)    Sexual History (orientation,birth control, marital status, STD):post menopausal, s/p hysterectomy, up to date with breast and pelvic exam per GYN: Dr. Elon Spanner  Depression/Suicide: Depression screen [facility] 2/9 07/13/2021 05/26/2020 09/05/2018  Decreased Interest 0 0 0  Down, Depressed, Hopeless 0 0 0  PHQ - 2 Score 0 0 0   Immunizations: (TDAP, Hep C screen, Pneumovax, Influenza, zoster)  Health Maintenance  Topic Date Due   Pneumococcal Vaccination (1 - PCV) Never done   HIV Screening  Never done   Hepatitis C Screening: USPSTF Recommendation to screen - Ages 89-56 yo.  Never done   COVID-19 Vaccine (6 - Booster for Moderna series) 06/02/2021   Tetanus Vaccine  08/31/2021   Mammogram  12/09/2021   Pap Smear  09/28/2023   Colon Cancer Screening  12/06/2024   Flu Shot  Completed   Zoster (Shingles) Vaccine  Completed   HPV Vaccine  Aged Out   Fall Risk: Fall  Risk  07/13/2021 05/26/2020 09/05/2018  Falls in the past year? 0 0 0  Number falls in past yr: 0 0 -  Injury with Fall? 0 0 -  Risk for fall due to : No Fall Risks - -  Follow up Falls evaluation completed - -   Medications and allergies reviewed with patient and updated if appropriate.  Patient Active Problem List   Diagnosis Date Noted   Family history of blood clots 07/13/2021   Vitamin D deficiency 07/13/2021   Elevated LDL cholesterol level 07/13/2021   At high risk for breast cancer 09/09/2020   Abnormal cervical Papanicolaou smear 09/03/2020     Current Outpatient Medications on File Prior to Visit  Medication Sig Dispense Refill   Calcium-Magnesium-Vitamin D (CALCIUM 1200+D3 PO)      cholecalciferol (VITAMIN D3) 25 MCG (1000 UT) tablet Take 1,000 Units by mouth daily.     Multiple Vitamins-Minerals (ONE-A-DAY WOMENS PO)      No current facility-administered medications on file prior to visit.    Past Medical History:  Diagnosis Date   Chicken pox    Mitral valve prolapse     Past Surgical History:  Procedure Laterality Date   ABDOMINAL HYSTERECTOMY     just uterus   BUNIONECTOMY  05/11/2020   GANGLION CYST EXCISION  1998   WISDOM TOOTH EXTRACTION  1989    Social History   Socioeconomic History   Marital status: Married  Spouse name: Not on file   Number of children: Not on file   Years of education: Not on file   Highest education level: Not on file  Occupational History   Not on file  Tobacco Use   Smoking status: Never   Smokeless tobacco: Never  Vaping Use   Vaping Use: Never used  Substance and Sexual Activity   Alcohol use: Yes    Comment: socially   Drug use: Never   Sexual activity: Yes    Birth control/protection: Surgical    Comment: s/p hysterectomy  Other Topics Concern   Not on file  Social History Narrative   Not on file   Social Determinants of Health   Financial Resource Strain: Not on file  Food Insecurity: Not on file   Transportation Needs: Not on file  Physical Activity: Not on file  Stress: Not on file  Social Connections: Not on file    Family History  Problem Relation Age of Onset   Hypertension Mother    Congestive Heart Failure Mother    Pulmonary Hypertension Mother    Breast cancer Mother        had 3 times   Cancer Father        prostate and skin   Glaucoma Father    Macular degeneration Father    Clotting disorder Brother    Diabetes Maternal Grandfather    Stroke Maternal Grandfather    Cancer Paternal Grandfather         Review of Systems  Constitutional:  Negative for fever, malaise/fatigue and weight loss.  HENT:  Negative for congestion and sore throat.   Eyes:        Negative for visual changes  Respiratory:  Negative for cough and shortness of breath.   Cardiovascular:  Negative for chest pain, palpitations and leg swelling.  Gastrointestinal:  Negative for blood in stool, constipation, diarrhea and heartburn.  Genitourinary:  Negative for dysuria, frequency and urgency.  Musculoskeletal:  Negative for falls, joint pain and myalgias.  Skin:  Negative for rash.  Neurological:  Negative for dizziness, sensory change and headaches.  Endo/Heme/Allergies:  Does not bruise/bleed easily.  Psychiatric/Behavioral:  Negative for depression, substance abuse and suicidal ideas. The patient is not nervous/anxious.    Objective:   Vitals:   07/13/21 0957  BP: 122/62  Pulse: 86  Temp: (!) 97.2 F (36.2 C)  SpO2: 100%    Body mass index is 21.18 kg/m.   Physical Examination:  Physical Exam Vitals reviewed.  Constitutional:      General: She is not in acute distress.    Appearance: She is well-developed.  HENT:     Right Ear: Tympanic membrane, ear canal and external ear normal.     Left Ear: Tympanic membrane, ear canal and external ear normal.  Eyes:     Extraocular Movements: Extraocular movements intact.     Conjunctiva/sclera: Conjunctivae normal.   Cardiovascular:     Rate and Rhythm: Normal rate and regular rhythm.     Heart sounds: Normal heart sounds.  Pulmonary:     Effort: Pulmonary effort is normal. No respiratory distress.     Breath sounds: Normal breath sounds.  Chest:     Chest wall: No tenderness.  Abdominal:     General: Bowel sounds are normal.     Palpations: Abdomen is soft.  Genitourinary:    Comments: Deferred breat and pelvic exam to GYN Musculoskeletal:        General: Normal range  of motion.     Cervical back: Normal range of motion and neck supple.     Right lower leg: No edema.     Left lower leg: No edema.  Lymphadenopathy:     Cervical: No cervical adenopathy.  Skin:    General: Skin is warm and dry.  Neurological:     Mental Status: She is alert and oriented to person, place, and time.     Deep Tendon Reflexes: Reflexes are normal and symmetric.  Psychiatric:        Mood and Affect: Mood normal.        Behavior: Behavior normal.        Thought Content: Thought content normal.   ASSESSMENT and PLAN: This visit occurred during the SARS-CoV-2 public health emergency.  Safety protocols were in place, including screening questions prior to the visit, additional usage of staff PPE, and extensive cleaning of exam room while observing appropriate contact time as indicated for disinfecting solutions.   Forrestine was seen today for establish care.  Diagnoses and all orders for this visit:  Encounter for preventative adult health care exam with abnormal findings -     Comprehensive metabolic panel; Future -     CBC with Differential/Platelet; Future -     TSH; Future  Vitamin D deficiency -     Vitamin D (25 hydroxy); Future  Elevated LDL cholesterol level -     Lipid panel; Future -     CRP High sensitivity; Future  Family history of blood clots -     Plasminogen Activator Inhibitor-1 (PAI-1) 4G/5G- [facility]; Future -     Lipoprotein A (LPA); Future -     Protein C, total and functional panel;  Future -     Protein S, total and functional panel -     PT AND PTT; Future     Problem List Items Addressed This Visit       Other   Elevated LDL cholesterol level    Repeat lipid panel Advised about the importance of heart healthy diet and daily exercise      Relevant Orders   Lipid panel   CRP High sensitivity   Family history of blood clots    Her brother recently diagnosed with multiple unprovoked DVTs in bilateral LE. He also had the following abnormal lab results: decreased Protein S activity, elevated lipoprotein A, and positive PAI-1 locus 4G/5G. She was advised to get tested She has no hx of PE/DVT, no tobacco use, no illicit drug use, social use of ETOH.  Ordered PT/PTT, Protein S and C panel, lipo A and PAI-1      Relevant Orders   Plasminogen Activator Inhibitor-1 (PAI-1) 4G/5G- [facility]   Lipoprotein A (LPA)   Protein C, total and functional panel   Protein S, total and functional panel   PT AND PTT   Vitamin D deficiency    Repeat Vit. D      Relevant Orders   Vitamin D (25 hydroxy)   Other Visit Diagnoses     Encounter for preventative adult health care exam with abnormal findings    -  Primary   Relevant Orders   Comprehensive metabolic panel   CBC with Differential/Platelet   TSH       Follow up: Return in about 1 year (around 07/13/2022) for CPE (fasting).  Alysia Penna, NP

## 2021-07-13 NOTE — Assessment & Plan Note (Signed)
Repeat lipid panel Advised about the importance of heart healthy diet and daily exercise 

## 2021-07-13 NOTE — Assessment & Plan Note (Signed)
Breast MRI completed annually

## 2021-07-13 NOTE — Assessment & Plan Note (Signed)
Repeat Vit. D 

## 2021-07-13 NOTE — Addendum Note (Signed)
Addended by: Varney Biles on: 07/13/2021 02:16 PM   Modules accepted: Orders

## 2021-07-14 ENCOUNTER — Other Ambulatory Visit (INDEPENDENT_AMBULATORY_CARE_PROVIDER_SITE_OTHER): Payer: 59

## 2021-07-14 ENCOUNTER — Other Ambulatory Visit: Payer: Self-pay | Admitting: Nurse Practitioner

## 2021-07-14 DIAGNOSIS — E559 Vitamin D deficiency, unspecified: Secondary | ICD-10-CM | POA: Diagnosis not present

## 2021-07-14 DIAGNOSIS — Z0001 Encounter for general adult medical examination with abnormal findings: Secondary | ICD-10-CM | POA: Diagnosis not present

## 2021-07-14 DIAGNOSIS — Z8249 Family history of ischemic heart disease and other diseases of the circulatory system: Secondary | ICD-10-CM | POA: Diagnosis not present

## 2021-07-14 DIAGNOSIS — E7800 Pure hypercholesterolemia, unspecified: Secondary | ICD-10-CM

## 2021-07-14 LAB — CBC WITH DIFFERENTIAL/PLATELET
Basophils Absolute: 0 10*3/uL (ref 0.0–0.1)
Basophils Relative: 0.8 % (ref 0.0–3.0)
Eosinophils Absolute: 0.1 10*3/uL (ref 0.0–0.7)
Eosinophils Relative: 1.4 % (ref 0.0–5.0)
HCT: 40.6 % (ref 36.0–46.0)
Hemoglobin: 13.5 g/dL (ref 12.0–15.0)
Lymphocytes Relative: 45.4 % (ref 12.0–46.0)
Lymphs Abs: 2.1 10*3/uL (ref 0.7–4.0)
MCHC: 33.4 g/dL (ref 30.0–36.0)
MCV: 93.2 fl (ref 78.0–100.0)
Monocytes Absolute: 0.3 10*3/uL (ref 0.1–1.0)
Monocytes Relative: 7.3 % (ref 3.0–12.0)
Neutro Abs: 2.1 10*3/uL (ref 1.4–7.7)
Neutrophils Relative %: 45.1 % (ref 43.0–77.0)
Platelets: 201 10*3/uL (ref 150.0–400.0)
RBC: 4.36 Mil/uL (ref 3.87–5.11)
RDW: 12.5 % (ref 11.5–15.5)
WBC: 4.6 10*3/uL (ref 4.0–10.5)

## 2021-07-14 LAB — COMPREHENSIVE METABOLIC PANEL
ALT: 12 U/L (ref 0–35)
AST: 16 U/L (ref 0–37)
Albumin: 4.7 g/dL (ref 3.5–5.2)
Alkaline Phosphatase: 58 U/L (ref 39–117)
BUN: 14 mg/dL (ref 6–23)
CO2: 28 mEq/L (ref 19–32)
Calcium: 9.9 mg/dL (ref 8.4–10.5)
Chloride: 101 mEq/L (ref 96–112)
Creatinine, Ser: 0.68 mg/dL (ref 0.40–1.20)
GFR: 97.44 mL/min (ref >60.00)
Glucose, Bld: 90 mg/dL (ref 70–99)
Potassium: 4 mEq/L (ref 3.5–5.1)
Sodium: 137 mEq/L (ref 135–145)
Total Bilirubin: 0.9 mg/dL (ref 0.2–1.2)
Total Protein: 7.5 g/dL (ref 6.0–8.3)

## 2021-07-14 LAB — LIPID PANEL
Cholesterol: 236 mg/dL — ABNORMAL HIGH (ref 0–200)
HDL: 109.6 mg/dL (ref >39.00)
LDL Cholesterol: 112 mg/dL — ABNORMAL HIGH (ref 0–99)
NonHDL: 126.74
Total CHOL/HDL Ratio: 2
Triglycerides: 75 mg/dL (ref 0.0–149.0)
VLDL: 15 mg/dL (ref 0.0–40.0)

## 2021-07-14 LAB — PROTIME-INR
INR: 1 ratio (ref 0.8–1.0)
Prothrombin Time: 11.2 s (ref 9.6–13.1)

## 2021-07-14 LAB — HIGH SENSITIVITY CRP: CRP, High Sensitivity: 0.57 mg/L (ref 0.000–5.000)

## 2021-07-14 LAB — TSH: TSH: 1.11 u[IU]/mL (ref 0.35–5.50)

## 2021-07-14 LAB — VITAMIN D 25 HYDROXY (VIT D DEFICIENCY, FRACTURES): VITD: 48.6 ng/mL (ref 30.00–100.00)

## 2021-07-14 LAB — APTT: aPTT: 31 s (ref 23.4–32.7)

## 2021-07-16 LAB — PROTEIN S ACTIVITY: Protein S Activity: 120 % normal (ref 60–140)

## 2021-07-20 LAB — PLASMINOGEN ACTIVATOR INHIBITOR-1 (PAI-1) 4G/5G

## 2021-07-20 LAB — PROTEIN C, TOTAL AND FUNCTIONAL PANEL
Protein C Activity: 96 % normal (ref 70–180)
Protein C Antigen: 96 % normal (ref 70–140)

## 2021-07-20 LAB — LIPOPROTEIN A (LPA): Lipoprotein (a): 270 nmol/L — ABNORMAL HIGH (ref <75)

## 2021-07-21 ENCOUNTER — Encounter: Payer: Self-pay | Admitting: Nurse Practitioner

## 2021-07-21 DIAGNOSIS — Z8249 Family history of ischemic heart disease and other diseases of the circulatory system: Secondary | ICD-10-CM

## 2021-07-21 DIAGNOSIS — D682 Hereditary deficiency of other clotting factors: Secondary | ICD-10-CM

## 2021-07-21 NOTE — Assessment & Plan Note (Signed)
Normal Protein S and C activity Positive 4G variant which means you are at risk of developing a clot and/or MI I recommend referral to hematologist to determine if anticoagulant is needed. Elevated lipo A which means you are at risk for cardiovascular disease. I recommend referral to cardiology to discuss need for coronary calcium score and use of statin. Ask if she is ok with referral?

## 2021-08-04 NOTE — Telephone Encounter (Signed)
Can you please and thank you enter the referral to the Hemotologist.  She is already established with Dr Eden Emms so she is going to call and schedule that appointment.   Dm/cma

## 2021-08-05 ENCOUNTER — Telehealth: Payer: Self-pay | Admitting: Oncology

## 2021-08-05 NOTE — Telephone Encounter (Signed)
Scheduled appt per 1/5 referral. Pt's aware of appt date and time. Pt is aware to arrive 15 mins and to bring insurance card.

## 2021-08-19 ENCOUNTER — Inpatient Hospital Stay: Payer: 59 | Attending: Oncology | Admitting: Oncology

## 2021-08-19 ENCOUNTER — Other Ambulatory Visit: Payer: Self-pay

## 2021-08-19 VITALS — BP 137/87 | HR 65 | Temp 97.7°F | Resp 16 | Ht 66.75 in | Wt 135.4 lb

## 2021-08-19 DIAGNOSIS — I341 Nonrheumatic mitral (valve) prolapse: Secondary | ICD-10-CM | POA: Insufficient documentation

## 2021-08-19 DIAGNOSIS — E7841 Elevated Lipoprotein(a): Secondary | ICD-10-CM | POA: Diagnosis not present

## 2021-08-19 DIAGNOSIS — Z8249 Family history of ischemic heart disease and other diseases of the circulatory system: Secondary | ICD-10-CM

## 2021-08-19 NOTE — Progress Notes (Signed)
Reason for the request:    Clotting disorder.  HPI: I was asked by Wilfred Lacy, NP to evaluate Debbie Glover for the evaluation of familial thromboembolism.  She is a 56 year old woman with history of mitral valve prolapse but no significant comorbid conditions.  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  Based on these findings, she had laboratory testing and found to have elevated lipoprotein a at 270 as well as a plasminogen activator inhibitor 4G/5G polymorphism.  Her protein S and protein C were normal.  She had a CT scan for cardiac scoring and showed a calcium score of 0.  Clinically, she is asymptomatic and has never had any thrombosis episodes.  She has been pregnant 3 separate occasions as well as been on birth control without any complications.  You have also had a hysterectomy and did not have any postoperative issues.  She remains very active and attends activities of daily living.   She does not report any headaches, blurry vision, syncope or seizures. Does not report any fevers, chills or sweats.  Does not report any cough, wheezing or hemoptysis.  Does not report any chest pain, palpitation, orthopnea or leg edema.  Does not report any nausea, vomiting or abdominal pain.  Does not report any constipation or diarrhea.  Does not report any skeletal complaints.    Does not report frequency, urgency or hematuria.  Does not report any skin rashes or lesions. Does not report any heat or cold intolerance.  Does not report any lymphadenopathy or petechiae.  Does not report any anxiety or depression.  Remaining review of systems is negative.     Past Medical History:  Diagnosis Date   Chicken pox    Mitral valve prolapse   :   Past Surgical History:  Procedure Laterality Date   ABDOMINAL HYSTERECTOMY     just uterus   BUNIONECTOMY  05/11/2020    GANGLION CYST EXCISION  1998   WISDOM TOOTH EXTRACTION  1989  :   Current Outpatient Medications:    Calcium-Magnesium-Vitamin D (CALCIUM 1200+D3 PO), , Disp: , Rfl:    cholecalciferol (VITAMIN D3) 25 MCG (1000 UT) tablet, Take 1,000 Units by mouth daily., Disp: , Rfl:    Multiple Vitamins-Minerals (ONE-A-DAY WOMENS PO), , Disp: , Rfl: :   Allergies  Allergen Reactions   Codeine Itching  :   Family History  Problem Relation Age of Onset   Hypertension Mother    Congestive Heart Failure Mother    Pulmonary Hypertension Mother    Breast cancer Mother        had 3 times   Cancer Father        prostate and skin   Glaucoma Father    Macular degeneration Father    Clotting disorder Brother    Diabetes Maternal Grandfather    Stroke Maternal Grandfather    Cancer Paternal Grandfather   :   Social History   Socioeconomic History   Marital status: Married    Spouse name: Not on file   Number of children: Not on file   Years of education: Not on file   Highest education level: Not on file  Occupational History   Not on file  Tobacco Use   Smoking status: Never   Smokeless tobacco: Never  Vaping Use   Vaping Use: Never used  Substance and Sexual Activity   Alcohol use: Yes  Comment: socially   Drug use: Never   Sexual activity: Yes    Birth control/protection: Surgical    Comment: s/p hysterectomy  Other Topics Concern   Not on file  Social History Narrative   Not on file   Social Determinants of Health   Financial Resource Strain: Not on file  Food Insecurity: Not on file  Transportation Needs: Not on file  Physical Activity: Not on file  Stress: Not on file  Social Connections: Not on file  Intimate Partner Violence: Not on file  :  Pertinent items are noted in HPI.  Exam: Blood pressure 137/87, pulse 65, temperature 97.7 F (36.5 C), temperature source Temporal, resp. rate 16, height 5' 6.75" (1.695 m), weight 135 lb 6.4 oz (61.4 kg), SpO2 100  %.  General appearance: alert and cooperative appeared without distress. Head: atraumatic without any abnormalities. Eyes: conjunctivae/corneas clear. PERRL.  Sclera anicteric. Throat: lips, mucosa, and tongue normal; without oral thrush or ulcers. Resp: clear to auscultation bilaterally without rhonchi, wheezes or dullness to percussion. Cardio: regular rate and rhythm, S1, S2 normal, no murmur, click, rub or gallop GI: soft, non-tender; bowel sounds normal; no masses,  no organomegaly Skin: Skin color, texture, turgor normal. No rashes or lesions Lymph nodes: Cervical, supraclavicular, and axillary nodes normal. Neurologic: Grossly normal without any motor, sensory or deep tendon reflexes. Musculoskeletal: No joint deformity or effusion.   Assessment and Plan:   56 year old with:  1.  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  Without any personal history of venous or arterial thrombosis.  The ramification of these findings were discussed today at this time.  Risk of recurrent venous thromboembolism as well as coronary artery disease complications were reviewed.  There is some association with this polymorphism and increased risk of venous thromboembolism in certain ethnic population although it is unclear the clinical implication in this particular setting.  I see no indication for preventative or prophylactic anticoagulation at this time.  Reducing risk of venous thromboembolism is always wise in this particular setting.  This would include weight loss, exercise and reduce immobilization whenever is possible.  As far as her coronary artery disease and risk of cardiac complications, reducing risk factors also recommended at this time including glycemic control, cholesterol management and healthy lifestyle.  She has been established with cardiologist and risk factor modification is already in place also had CT cardiac  scoring.  Overall I do not feel there is any further testing or evaluation needed for this polymorphism.   2.  Follow-up: I am happy to see her in the future as needed.  45  minutes were dedicated to this visit. The time was spent on reviewing laboratory data, imaging studies, discussing treatment options, discussing differential diagnosis and answering questions regarding future plan.    A copy of this consult has been forwarded to the requesting physician.

## 2021-08-22 DIAGNOSIS — M8580 Other specified disorders of bone density and structure, unspecified site: Secondary | ICD-10-CM | POA: Insufficient documentation

## 2021-08-24 ENCOUNTER — Other Ambulatory Visit: Payer: Self-pay | Admitting: Obstetrics and Gynecology

## 2021-08-24 DIAGNOSIS — Z9189 Other specified personal risk factors, not elsewhere classified: Secondary | ICD-10-CM

## 2021-10-17 ENCOUNTER — Other Ambulatory Visit: Payer: 59

## 2021-10-19 ENCOUNTER — Ambulatory Visit
Admission: RE | Admit: 2021-10-19 | Discharge: 2021-10-19 | Disposition: A | Discharge status: Home / Self Care | Payer: 59 | Source: Ambulatory Visit | Attending: Obstetrics and Gynecology | Admitting: Obstetrics and Gynecology

## 2021-10-19 ENCOUNTER — Other Ambulatory Visit: Payer: Self-pay

## 2021-10-19 DIAGNOSIS — Z9189 Other specified personal risk factors, not elsewhere classified: Secondary | ICD-10-CM

## 2021-10-19 LAB — HM MAMMOGRAPHY

## 2021-10-19 IMAGING — MR MR BREAST BILAT WO/W CM
8 of 12 series · 33 of 48 positions shown · IV contrast (6ml Gadavist)
Comparison: Breast MRI [DATE]. Mammography [DATE] and
earlier.

CLINICAL DATA: 55-year-old with a family history of breast cancer
in her mother at age 55 and in a maternal aunt. Elevated estimated
lifetime risk of breast cancer of 25%. Supplemental high risk
screening.

EXAM:
BILATERAL BREAST MRI WITH AND WITHOUT CONTRAST
TECHNIQUE: Multiplanar, multisequence MR images of both breasts were obtained
prior to and following the intravenous administration of 6 ml of
Gadavist.

[Series 2: t2_tirm_tra ipat (a-p) · axial · 3.0mm · 0.70mm/px · 1 of 55 slices shown]
[im 1/55]
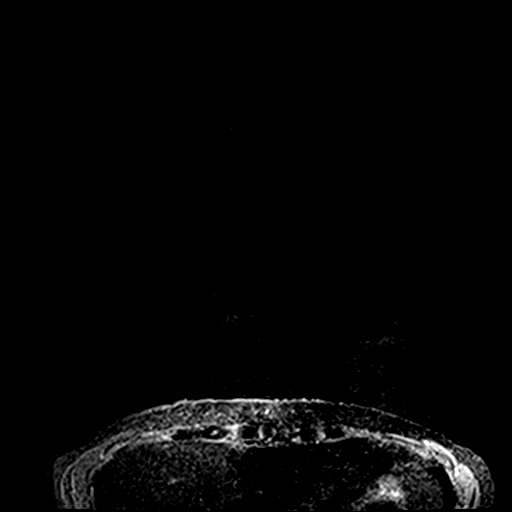

[Series 3: fl3d pre-cm no · axial · non-contrast · 1.2mm · 0.94mm/px · z∈[-62,+110]mm · 5 of 144 slices shown]
[im 1/144]
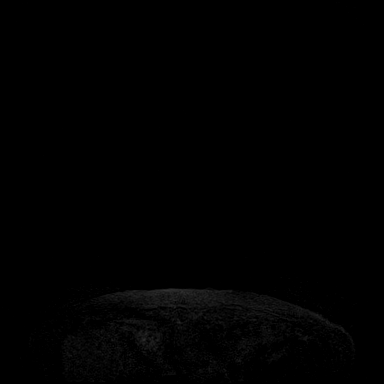
[im 36/144]
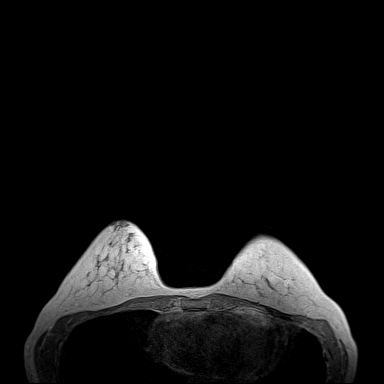
[im 72/144]
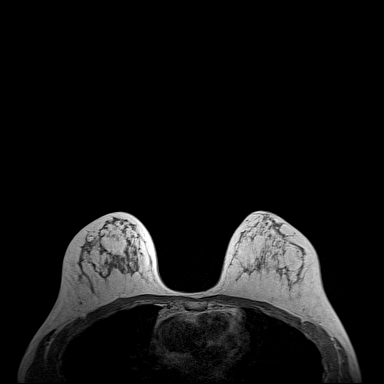
[im 108/144]
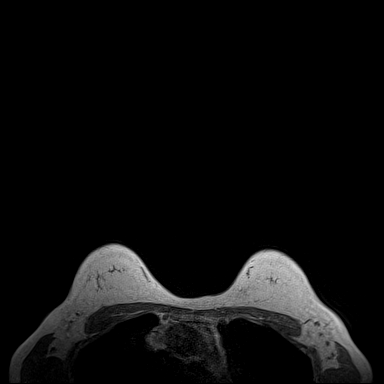
[im 144/144]
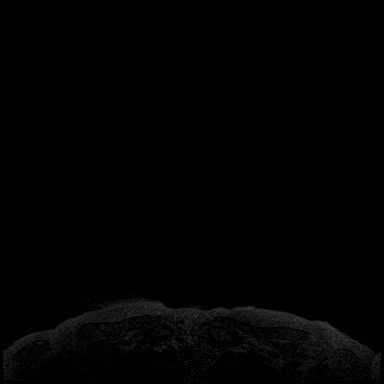

[Series 4: fl3d pre-cm · axial · non-contrast · 1.2mm · 0.94mm/px · z∈[-62,+110]mm · 5 of 144 slices shown]
[im 1/144]
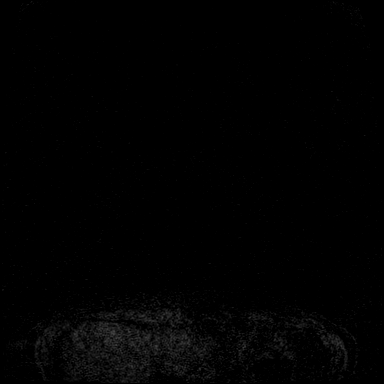
[im 36/144]
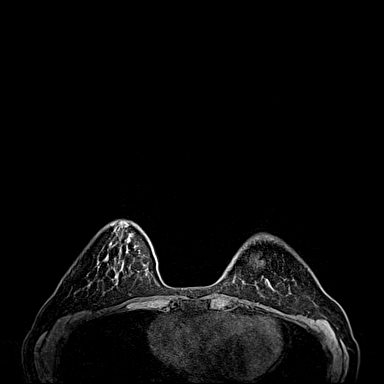
[im 72/144]
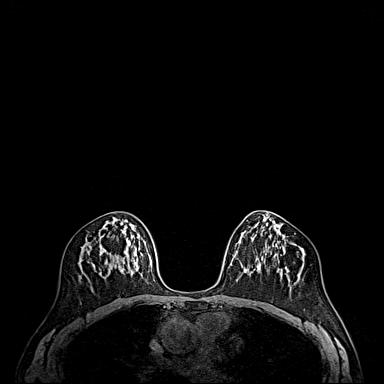
[im 108/144]
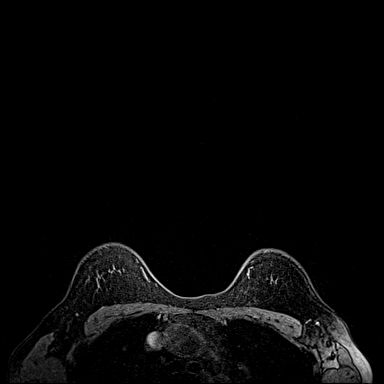
[im 144/144]
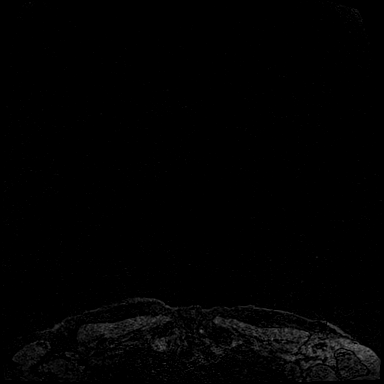

[Series 5: fl3d post immediate · axial · 1.2mm · 0.94mm/px · z∈[-62,+110]mm · 5 of 144 slices shown (1 of 3)]
[im 1/144]
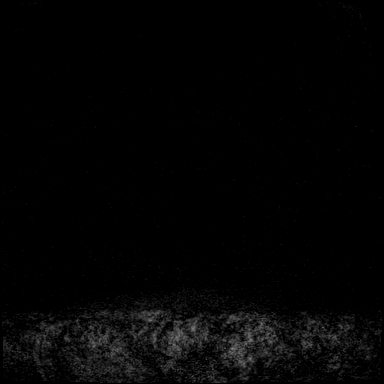
[im 36/144]
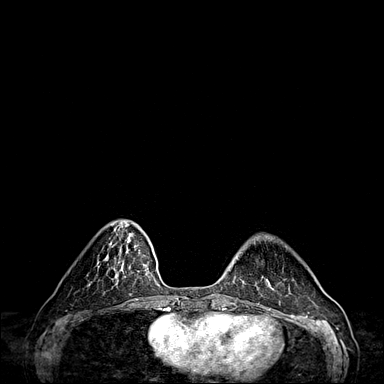
[im 72/144]
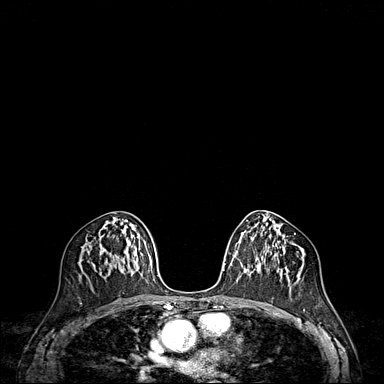
[im 108/144]
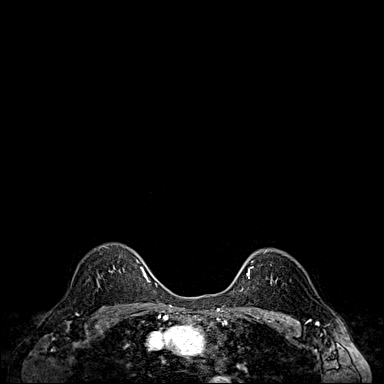
[im 144/144]
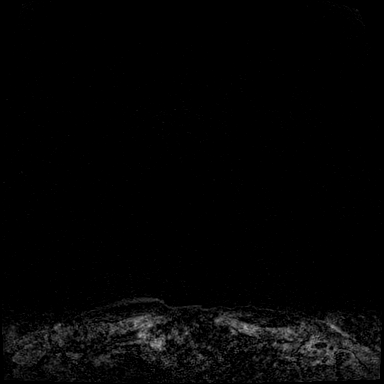

[Series 6: fl3d post immediate · axial · 1.2mm · 0.94mm/px · z∈[-62,+110]mm · 5 of 144 slices shown (2 of 3)]
[im 1/144]
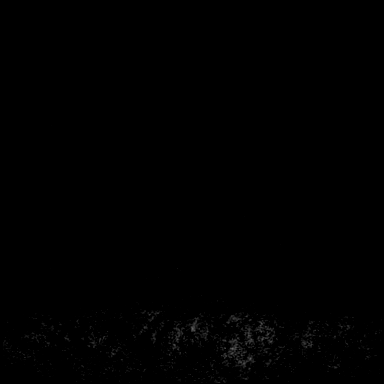
[im 36/144]
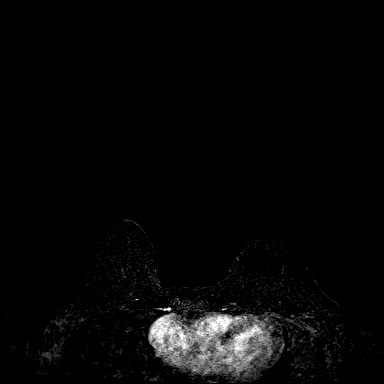
[im 72/144]
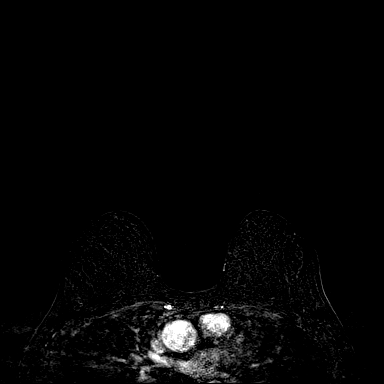
[im 108/144]
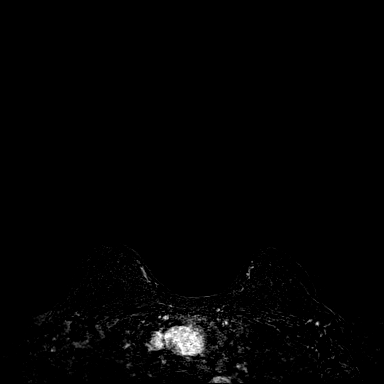
[im 144/144]
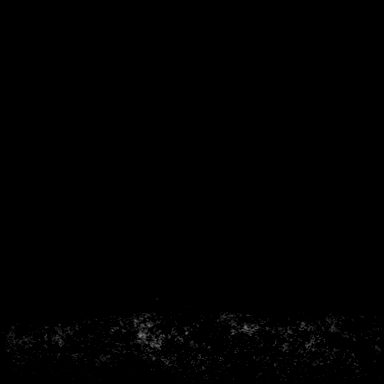

[Series 7: fl3d post immediate · axial · 172.8mm · 0.94mm/px · 1 of 1 slices shown (3 of 3)]
[im 1/1]
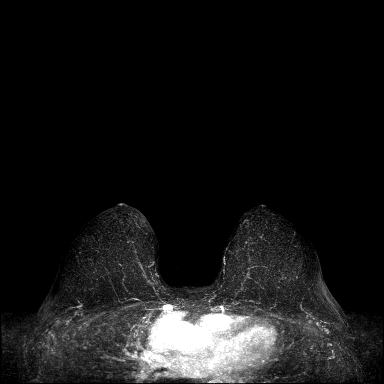

[Series 8: fl3d post 3min · axial · 1.2mm · 0.94mm/px · z∈[-62,+110]mm · 6 of 144 slices shown]
[im 1/144]
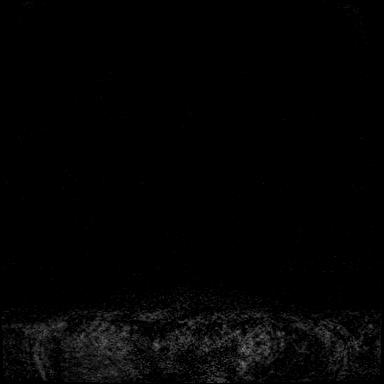
[im 29/144]
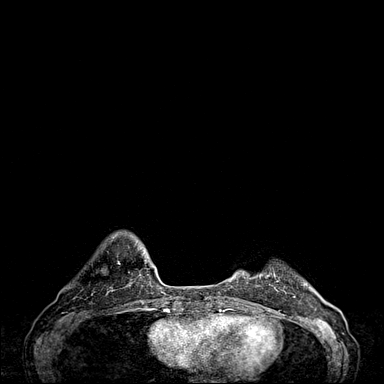
[im 58/144]
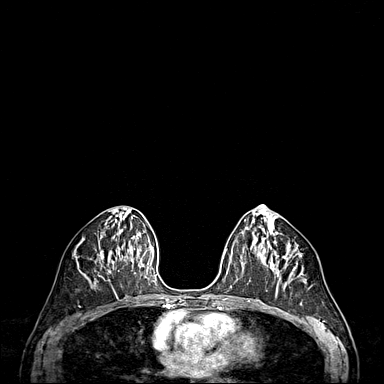
[im 86/144]
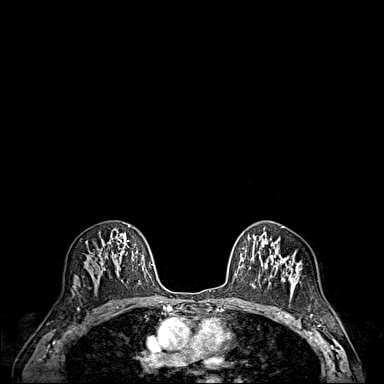
[im 115/144]
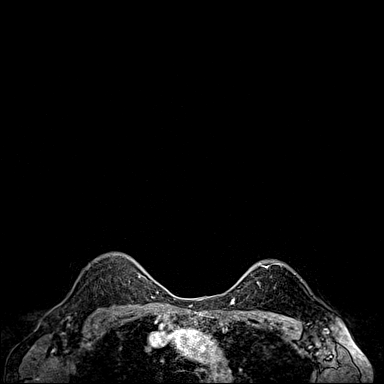
[im 144/144]
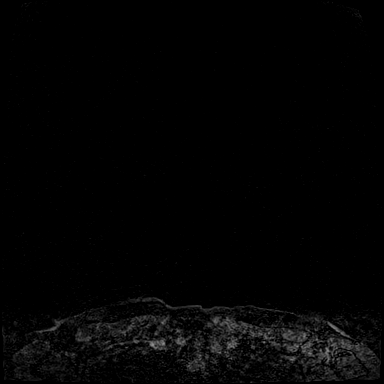

[Series 9: fl3d post 3min_sub · axial · 1.2mm · 0.94mm/px · z∈[-62,+75]mm · 5 of 144 slices shown]
[im 1/144]
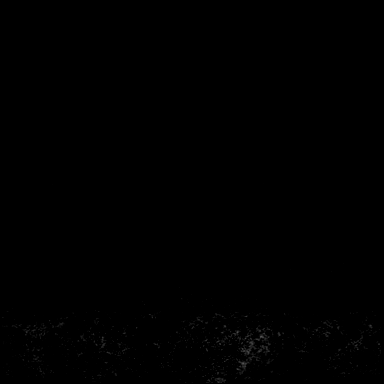
[im 29/144]
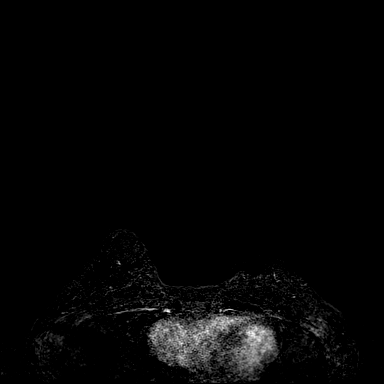
[im 58/144]
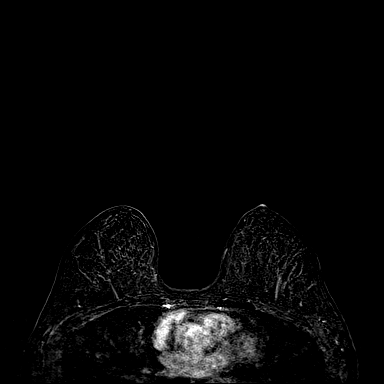
[im 86/144]
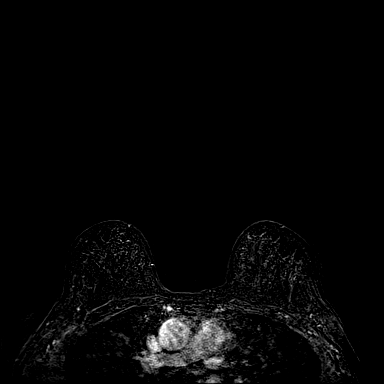
[im 115/144]
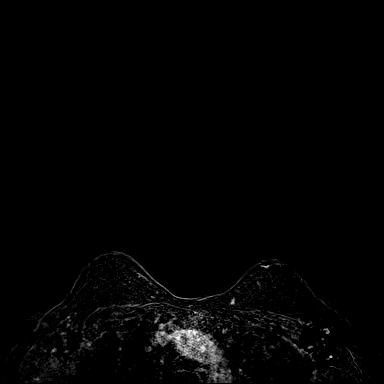

[33 of 48 positions shown; findings below may reference images not displayed]

Three-dimensional MR images were rendered by post-processing of the
original MR data on an independent workstation. The
three-dimensional MR images were interpreted, and findings are
reported in the following complete MRI report for this study. Three
dimensional images were evaluated at the independent interpreting
workstation using the DynaCAD thin client.
FINDINGS: Breast composition: c. Heterogeneous fibroglandular tissue.

Background parenchymal enhancement: Moderate.

Right breast: No suspicious mass or abnormal enhancement.

Left breast: No suspicious mass or abnormal enhancement.

Lymph nodes: No abnormal appearing lymph nodes.

Ancillary findings:  None.
IMPRESSION: No MRI evidence of malignancy involving either breast.

RECOMMENDATION:
1. Annual screening mammography which is due in late [REDACTED] or
[DATE].
2. Consider annual supplemental high risk screening MRI given the
patient's estimated lifetime risk of breast cancer of greater than
20%.

BI-RADS CATEGORY  1: Negative.

## 2021-10-19 MED ORDER — GADOBUTROL 1 MMOL/ML IV SOLN
6.0000 mL | Freq: Once | INTRAVENOUS | Status: AC | PRN
  Administered 2021-10-19: 6 mL via INTRAVENOUS

## 2021-11-16 ENCOUNTER — Ambulatory Visit: Payer: 59 | Admitting: Cardiovascular Disease

## 2021-12-01 NOTE — Progress Notes (Signed)
Evaluation Performed:  Follow-up visit  Date:  12/15/2021   ID:  Debbie Glover, DOB 13-Aug-1965, MRN [id number]  PCP:  Anne Ng, NP  Cardiologist:  Charlton Haws, MD New/[facility] Electrophysiologist:  None   Chief Complaint:  MVP/Syncope  History of Present Illness:    Debbie Glover is a 56 y.o. female married to one of my patients They moved to [facility] from [place] in 2018. 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 She is unsure about history of murmur and MVP seems to have been an echo diagnosis with mild MR by TTE 2017 She has not had recurrent syncope and monitor only showed PAC;s. She gets rare palpitations at this time that do not last long   No chest pain, dyspnea or TIA  Echo reviewed 02/06/19 EF 55-60% no prolapse mild MR   LDL 112 but calcium score 11/07/79 was 0  Brother had unprovoked DVT with protein S deficiency Her testing showed elevated LpA but normal protein S/C  She had Plasminogen activator inhibitor polymorphism  She is looking forward to [place] cruise     Past Medical History:  Diagnosis Date   Chicken pox    Mitral valve prolapse    Past Surgical History:  Procedure Laterality Date   ABDOMINAL HYSTERECTOMY     just uterus   BUNIONECTOMY  05/11/2020   GANGLION CYST EXCISION  1998   WISDOM TOOTH EXTRACTION  1989     Current Meds  Medication Sig   Calcium-Magnesium-Vitamin D (CALCIUM 1200+D3 PO)    cholecalciferol (VITAMIN D3) 25 MCG (1000 UT) tablet Take 1,000 Units by mouth daily.   Multiple Vitamins-Minerals (ONE-A-DAY WOMENS PO)      Allergies:   Codeine   Social History   Tobacco Use   Smoking status: Never   Smokeless tobacco: Never  Vaping Use   Vaping Use: Never used  Substance Use Topics   Alcohol use: Yes     Comment: socially   Drug use: Never     Family Hx: The patient's family history includes Breast cancer in her mother; Cancer in her father and paternal grandfather; Clotting disorder in her brother; Congestive Heart Failure in her mother; Diabetes in her maternal grandfather; Glaucoma in her father; Hypertension in her mother; Macular degeneration in her father; Pulmonary Hypertension in her mother; Stroke in her maternal grandfather.  ROS:   Please see the history of present illness.     All other systems reviewed and are negative.   Prior CV studies:    Echo 02/06/19  Labs/Other Tests and Data Reviewed:    EKG:  NSR normal ECG 12/15/2021 SR rate 71 insig Q waves inferior leads  12/15/2021 NSR rate 60 ICRBBB no acute changes   Recent Labs: 07/14/2021: ALT 12; BUN 14; Creatinine, Ser 0.68; Hemoglobin 13.5; Platelets 201.0; Potassium 4.0; Sodium 137; TSH 1.11   Recent Lipid Panel Lab Results  Component Value Date/Time   CHOL 236 (H) 07/14/2021 07:59 AM   TRIG 75.0 07/14/2021 07:59 AM   HDL 109.60 07/14/2021 07:59 AM   CHOLHDL 2 07/14/2021 07:59 AM   LDLCALC 112 (H) 07/14/2021 07:59 AM    Wt Readings from Last 3 Encounters:  12/15/21 133 lb (60.3 kg)  08/19/21 135 lb 6.4 oz (61.4 kg)  07/13/21 134  lb 3.2 oz (60.9 kg)     Objective:    Vital Signs:  BP 114/78   Pulse 60   Ht 5\' 7"  (1.702 m)   Wt 133 lb (60.3 kg)   SpO2 98%   BMI 20.83 kg/m    Affect appropriate Healthy:  appears stated age HEENT: normal Neck supple with no adenopathy JVP normal no bruits no thyromegaly Lungs clear with no wheezing and good diaphragmatic motion Heart:  S1/S2 no murmur, no rub, gallop or click PMI normal Abdomen: benighn, BS positve, no tenderness, no AAA no bruit.  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  Concerns regarding medicines are outlined above.  Tests Ordered:  None    Medication Changes: No orders of the defined types were placed in this encounter.   Disposition:  Follow up in a year   Signed, 08/28/20, MD  12/15/2021 9:22 AM    [facility]

## 2021-12-15 ENCOUNTER — Encounter: Payer: Self-pay | Admitting: Cardiovascular Disease

## 2021-12-15 ENCOUNTER — Ambulatory Visit: Payer: 59 | Admitting: Cardiovascular Disease

## 2021-12-15 VITALS — BP 114/78 | HR 60 | Ht 67.0 in | Wt 133.0 lb

## 2021-12-15 DIAGNOSIS — I341 Nonrheumatic mitral (valve) prolapse: Secondary | ICD-10-CM | POA: Diagnosis not present

## 2021-12-15 NOTE — Patient Instructions (Signed)

## 2022-06-13 ENCOUNTER — Telehealth: Payer: Self-pay | Admitting: Nurse Practitioner

## 2022-06-13 NOTE — Telephone Encounter (Signed)
Pt had MM on 3/22 with [facility]. Please abstract

## 2022-07-19 ENCOUNTER — Encounter: Payer: Self-pay | Admitting: Nurse Practitioner

## 2022-07-19 ENCOUNTER — Ambulatory Visit (INDEPENDENT_AMBULATORY_CARE_PROVIDER_SITE_OTHER): Payer: 59 | Admitting: Nurse Practitioner

## 2022-07-19 VITALS — BP 116/76 | HR 74 | Temp 96.9°F | Ht 67.0 in | Wt 137.2 lb

## 2022-07-19 DIAGNOSIS — E7800 Pure hypercholesterolemia, unspecified: Secondary | ICD-10-CM | POA: Diagnosis not present

## 2022-07-19 DIAGNOSIS — Z23 Encounter for immunization: Secondary | ICD-10-CM | POA: Diagnosis not present

## 2022-07-19 DIAGNOSIS — Z0001 Encounter for general adult medical examination with abnormal findings: Secondary | ICD-10-CM | POA: Diagnosis not present

## 2022-07-19 DIAGNOSIS — M8580 Other specified disorders of bone density and structure, unspecified site: Secondary | ICD-10-CM | POA: Insufficient documentation

## 2022-07-19 NOTE — Assessment & Plan Note (Signed)
MRI and mammogram completed 35months apart annually

## 2022-07-19 NOTE — Progress Notes (Signed)
Complete physical exam  Patient: Debbie Glover   DOB: Dec 23, 1965   56 y.o. Female  MRN: [id number] Visit Date: 07/19/2022  Subjective:    Chief Complaint  Patient presents with   Annual Exam    CPE Pt not fasting  Tdap given today    Debbie Glover is a 56 y.o. female who presents today for a complete physical exam. She reports consuming a low fat diet.  Walking and weight trainging  She generally feels well. She reports sleeping well. She does not have additional problems to discuss today.  Vision:Yes Dental:Yes STD Screen:No  BP Readings from Last 3 Encounters:  07/19/22 116/76  12/15/21 114/78  08/19/21 137/87    Wt Readings from Last 3 Encounters:  07/19/22 137 lb 3.2 oz (62.2 kg)  12/15/21 133 lb (60.3 kg)  08/19/21 135 lb 6.4 oz (61.4 kg)    Most recent fall risk assessment:    07/19/2022    8:33 AM  [place] in the past year? 0  Number falls in past yr: 0  Injury with Fall? 0   Depression screen:Yes - No Depression  Most recent depression screenings:    07/19/2022    9:10 AM 07/13/2021   10:07 AM  PHQ 2/9 Scores  PHQ - 2 Score 0 0  PHQ- 9 Score 0    HPI  At high risk for breast cancer MRI and mammogram completed 31months apart annually  Osteopenia after menopause Dexa scan due 07/2022 Current use of calcium and vit. D  Past Medical History:  Diagnosis Date   Chicken pox    Mitral valve prolapse    Past Surgical History:  Procedure Laterality Date   ABDOMINAL HYSTERECTOMY     just uterus   BUNIONECTOMY  05/11/2020   GANGLION CYST EXCISION  1998   WISDOM TOOTH EXTRACTION  1989   Social History   Socioeconomic History   Marital status: Married    Spouse name: Not on file   Number of children: Not on file   Years of education: Not on file   Highest education level: Not on file  Occupational History   Not on file  Tobacco Use   Smoking status: Never   Smokeless tobacco: Never  Vaping Use   Vaping Use: Never used  Substance  and Sexual Activity   Alcohol use: Yes    Comment: socially   Drug use: Never   Sexual activity: Yes    Birth control/protection: Surgical    Comment: s/p hysterectomy  Other Topics Concern   Not on file  Social History Narrative   Not on file   Social Determinants of Health   Financial Resource Strain: Not on file  Food Insecurity: Not on file  Transportation Needs: Not on file  Physical Activity: Not on file  Stress: Not on file  Social Connections: Not on file  Intimate Partner Violence: Not on file   Family Status  Relation Name Status   Mother  Deceased   Father  Deceased   Brother  Alive   MGF  Deceased   PGF  Deceased   Family History  Problem Relation Age of Onset   Hypertension Mother    Congestive Heart Failure Mother    Pulmonary Hypertension Mother    Breast cancer Mother        had 3 times   Cancer Father        prostate and skin   Glaucoma Father    Macular degeneration  Father    Clotting disorder Brother    Diabetes Maternal Grandfather    Stroke Maternal Grandfather    Cancer Paternal Grandfather    Allergies  Allergen Reactions   Codeine Itching    Patient Care Team: Debbie Glover, Debbie Gains, NP as PCP - General (Internal Medicine) Wendall Stade, MD as PCP - Cardiology (Cardiology)   Medications: Outpatient Medications Prior to Visit  Medication Sig   Calcium-Magnesium-Vitamin D (CALCIUM 1200+D3 PO)    cholecalciferol (VITAMIN D3) 25 MCG (1000 UT) tablet Take 1,000 Units by mouth daily.   Multiple Vitamins-Minerals (ONE-A-DAY WOMENS PO)    No facility-administered medications prior to visit.    Review of Systems  Constitutional:  Negative for fever.  HENT:  Negative for congestion and sore throat.   Eyes:        Negative for visual changes  Respiratory:  Negative for cough and shortness of breath.   Cardiovascular:  Negative for chest pain, palpitations and leg swelling.  Gastrointestinal:  Negative for blood in stool, constipation  and diarrhea.  Genitourinary:  Negative for dysuria, frequency and urgency.  Musculoskeletal:  Negative for myalgias.  Skin:  Negative for rash.  Neurological:  Negative for dizziness and headaches.  Hematological:  Does not bruise/bleed easily.  Psychiatric/Behavioral:  Negative for suicidal ideas. The patient is not nervous/anxious.         Objective:  BP 116/76 (BP Location: Right Arm, Patient Position: Sitting, Cuff Size: Small)   Pulse 74   Temp (!) 96.9 F (36.1 C) (Temporal)   Ht 5\' 7"  (1.702 m)   Wt 137 lb 3.2 oz (62.2 kg)   SpO2 98%   BMI 21.49 kg/m     Physical Exam Vitals reviewed.  Constitutional:      General: She is not in acute distress.    Appearance: She is well-developed.  HENT:     Right Ear: Tympanic membrane, ear canal and external ear normal.     Left Ear: Tympanic membrane, ear canal and external ear normal.     Nose: Nose normal.  Eyes:     Extraocular Movements: Extraocular movements intact.     Conjunctiva/sclera: Conjunctivae normal.     Pupils: Pupils are equal, round, and reactive to light.  Cardiovascular:     Rate and Rhythm: Normal rate and regular rhythm.     Pulses: Normal pulses.     Heart sounds: Normal heart sounds.  Pulmonary:     Effort: Pulmonary effort is normal. No respiratory distress.     Breath sounds: Normal breath sounds.  Chest:     Chest wall: No tenderness.  Abdominal:     General: Bowel sounds are normal.     Palpations: Abdomen is soft.  Genitourinary:    Comments: Breast and pelvic exam deferred to GYN Musculoskeletal:        General: Normal range of motion.     Cervical back: Normal range of motion and neck supple.     Right lower leg: No edema.     Left lower leg: No edema.  Lymphadenopathy:     Cervical: No cervical adenopathy.  Skin:    General: Skin is warm and dry.  Neurological:     Mental Status: She is alert and oriented to person, place, and time.     Deep Tendon Reflexes: Reflexes are normal  and symmetric.  Psychiatric:        Mood and Affect: Mood normal.        Behavior: Behavior  normal.        Thought Content: Thought content normal.      No results found for any visits on 07/19/22.    Assessment & Plan:    Routine Health Maintenance and Physical Exam  Immunization History  Administered Date(s) Administered   Influenza,inj,Quad PF,6+ Mos 09/05/2018, 05/26/2020   Influenza-Unspecified 05/04/2021, 06/04/2022   Moderna Sars-Covid-2 Vaccination 09/30/2019, 10/28/2019, 05/30/2020, 11/07/2020, 04/07/2021   Td 07/19/2022   Tdap 09/01/2011   Zoster Recombinat (Shingrix) 02/27/2020, 05/03/2020   Health Maintenance  Topic Date Due   COVID-19 Vaccine (6 - 2023-24 season) 08/04/2022 (Originally 03/31/2022)   Hepatitis C Screening  07/20/2023 (Originally 03/26/1983)   HIV Screening  07/20/2023 (Originally 03/25/1980)   PAP SMEAR-Modifier  09/28/2023   MAMMOGRAM  10/20/2023   COLONOSCOPY (Pts 45-66yrs Insurance coverage will need to be confirmed)  12/06/2024   DTaP/Tdap/Td (3 - Td or Tdap) 07/19/2032   INFLUENZA VACCINE  Completed   Zoster Vaccines- Shingrix  Completed   HPV VACCINES  Aged Out   Discussed health benefits of physical activity, and encouraged her to engage in regular exercise appropriate for her age and condition.  Problem List Items Addressed This Visit       Other   Elevated LDL cholesterol level   Relevant Orders   Lipid panel   Other Visit Diagnoses     Encounter for preventative adult health care exam with abnormal findings    -  Primary   Relevant Orders   Comprehensive metabolic panel   CBC   Need for tetanus booster       Relevant Orders   Td : Tetanus/diphtheria >7yo Preservative  free (Completed)      Return in about 1 year (around 07/20/2023) for CPE (fasting).     Wilfred Lacy, NP

## 2022-07-19 NOTE — Patient Instructions (Signed)
Schedule fasting lab appt.  Preventive Care 51-57 Years Old, Female Preventive care refers to lifestyle choices and visits with your health care provider that can promote health and wellness. Preventive care visits are also called wellness exams. What can I expect for my preventive care visit? Counseling Your health care provider may ask you questions about your: Medical history, including: Past medical problems. Family medical history. Pregnancy history. Current health, including: Menstrual cycle. Method of birth control. Emotional well-being. Home life and relationship well-being. Sexual activity and sexual health. Lifestyle, including: Alcohol, nicotine or tobacco, and drug use. Access to firearms. Diet, exercise, and sleep habits. Work and work Statistician. Sunscreen use. Safety issues such as seatbelt and bike helmet use. Physical exam Your health care provider will check your: Height and weight. These may be used to calculate your BMI (body mass index). BMI is a measurement that tells if you are at a healthy weight. Waist circumference. This measures the distance around your waistline. This measurement also tells if you are at a healthy weight and may help predict your risk of certain diseases, such as type 2 diabetes and high blood pressure. Heart rate and blood pressure. Body temperature. Skin for abnormal spots. What immunizations do I need?  Vaccines are usually given at various ages, according to a schedule. Your health care provider will recommend vaccines for you based on your age, medical history, and lifestyle or other factors, such as travel or where you work. What tests do I need? Screening Your health care provider may recommend screening tests for certain conditions. This may include: Lipid and cholesterol levels. Diabetes screening. This is done by checking your blood sugar (glucose) after you have not eaten for a while (fasting). Pelvic exam and Pap  test. Hepatitis B test. Hepatitis C test. HIV (human immunodeficiency virus) test. STI (sexually transmitted infection) testing, if you are at risk. Lung cancer screening. Colorectal cancer screening. Mammogram. Talk with your health care provider about when you should start having regular mammograms. This may depend on whether you have a family history of breast cancer. BRCA-related cancer screening. This may be done if you have a family history of breast, ovarian, tubal, or peritoneal cancers. Bone density scan. This is done to screen for osteoporosis. Talk with your health care provider about your test results, treatment options, and if necessary, the need for more tests. Follow these instructions at home: Eating and drinking  Eat a diet that includes fresh fruits and vegetables, whole grains, lean protein, and low-fat dairy products. Take vitamin and mineral supplements as recommended by your health care provider. Do not drink alcohol if: Your health care provider tells you not to drink. You are pregnant, may be pregnant, or are planning to become pregnant. If you drink alcohol: Limit how much you have to 0-1 drink a day. Know how much alcohol is in your drink. In the [place] one drink equals one 12 oz bottle of beer (355 mL), one 5 oz glass of wine (148 mL), or one 1 oz glass of hard liquor (44 mL). Lifestyle Brush your teeth every morning and night with fluoride toothpaste. Floss one time each day. Exercise for at least 30 minutes 5 or more days each week. Do not use any products that contain nicotine or tobacco. These products include cigarettes, chewing tobacco, and vaping devices, such as e-cigarettes. If you need help quitting, ask your health care provider. Do not use drugs. If you are sexually active, practice safe sex. Use a condom or  other form of protection to prevent STIs. If you do not wish to become pregnant, use a form of birth control. If you plan to become pregnant,  see your health care provider for a prepregnancy visit. Take aspirin only as told by your health care provider. Make sure that you understand how much to take and what form to take. Work with your health care provider to find out whether it is safe and beneficial for you to take aspirin daily. Find healthy ways to manage stress, such as: Meditation, yoga, or listening to music. Journaling. Talking to a trusted person. Spending time with friends and family. Minimize exposure to UV radiation to reduce your risk of skin cancer. Safety Always wear your seat belt while driving or riding in a vehicle. Do not drive: If you have been drinking alcohol. Do not ride with someone who has been drinking. When you are tired or distracted. While texting. If you have been using any mind-altering substances or drugs. Wear a helmet and other protective equipment during sports activities. If you have firearms in your house, make sure you follow all gun safety procedures. Seek help if you have been physically or sexually abused. What's next? Visit your health care provider once a year for an annual wellness visit. Ask your health care provider how often you should have your eyes and teeth checked. Stay up to date on all vaccines. This information is not intended to replace advice given to you by your health care provider. Make sure you discuss any questions you have with your health care provider. Document Revised: 01/12/2021 Document Reviewed: 01/12/2021 Elsevier Patient Education  [place].

## 2022-07-19 NOTE — Assessment & Plan Note (Signed)
Coronary calcium score at 0 No tobacco use Repeat lipid panel

## 2022-07-19 NOTE — Assessment & Plan Note (Signed)
Dexa scan due 07/2022 Current use of calcium and vit. D

## 2022-07-20 ENCOUNTER — Other Ambulatory Visit (INDEPENDENT_AMBULATORY_CARE_PROVIDER_SITE_OTHER): Payer: 59

## 2022-07-20 ENCOUNTER — Other Ambulatory Visit: Payer: Self-pay | Admitting: Nurse Practitioner

## 2022-07-20 DIAGNOSIS — R5383 Other fatigue: Secondary | ICD-10-CM

## 2022-07-20 DIAGNOSIS — E7800 Pure hypercholesterolemia, unspecified: Secondary | ICD-10-CM | POA: Diagnosis not present

## 2022-07-20 DIAGNOSIS — Z0001 Encounter for general adult medical examination with abnormal findings: Secondary | ICD-10-CM | POA: Diagnosis not present

## 2022-07-20 LAB — CBC
HCT: 41.1 % (ref 36.0–46.0)
Hemoglobin: 13.7 g/dL (ref 12.0–15.0)
MCHC: 33.4 g/dL (ref 30.0–36.0)
MCV: 91.6 fl (ref 78.0–100.0)
Platelets: 230 10*3/uL (ref 150.0–400.0)
RBC: 4.49 Mil/uL (ref 3.87–5.11)
RDW: 12.4 % (ref 11.5–15.5)
WBC: 5 10*3/uL (ref 4.0–10.5)

## 2022-07-20 LAB — COMPREHENSIVE METABOLIC PANEL
ALT: 14 U/L (ref 0–35)
AST: 18 U/L (ref 0–37)
Albumin: 4.6 g/dL (ref 3.5–5.2)
Alkaline Phosphatase: 59 U/L (ref 39–117)
BUN: 13 mg/dL (ref 6–23)
CO2: 29 mEq/L (ref 19–32)
Calcium: 9.6 mg/dL (ref 8.4–10.5)
Chloride: 101 mEq/L (ref 96–112)
Creatinine, Ser: 0.68 mg/dL (ref 0.40–1.20)
GFR: 96.75 mL/min (ref >60.00)
Glucose, Bld: 89 mg/dL (ref 70–99)
Potassium: 3.9 mEq/L (ref 3.5–5.1)
Sodium: 138 mEq/L (ref 135–145)
Total Bilirubin: 0.6 mg/dL (ref 0.2–1.2)
Total Protein: 7.4 g/dL (ref 6.0–8.3)

## 2022-07-20 LAB — LIPID PANEL
Cholesterol: 236 mg/dL — ABNORMAL HIGH (ref 0–200)
HDL: 93.1 mg/dL (ref >39.00)
LDL Cholesterol: 127 mg/dL — ABNORMAL HIGH (ref 0–99)
NonHDL: 142.66
Total CHOL/HDL Ratio: 3
Triglycerides: 76 mg/dL (ref 0.0–149.0)
VLDL: 15.2 mg/dL (ref 0.0–40.0)

## 2022-07-20 LAB — VITAMIN B12: Vitamin B-12: 693 pg/mL (ref 211–911)

## 2022-07-20 LAB — FERRITIN: Ferritin: 115.2 ng/mL (ref 10.0–291.0)

## 2022-09-06 ENCOUNTER — Other Ambulatory Visit: Payer: Self-pay | Admitting: Obstetrics and Gynecology

## 2022-09-06 DIAGNOSIS — Z9189 Other specified personal risk factors, not elsewhere classified: Secondary | ICD-10-CM

## 2022-11-16 ENCOUNTER — Encounter: Payer: Self-pay | Admitting: Nurse Practitioner

## 2022-11-16 ENCOUNTER — Ambulatory Visit (INDEPENDENT_AMBULATORY_CARE_PROVIDER_SITE_OTHER): Payer: 59 | Admitting: Nurse Practitioner

## 2022-11-16 VITALS — BP 116/74 | HR 81 | Temp 98.6°F | Resp 16 | Ht 67.0 in | Wt 137.4 lb

## 2022-11-16 DIAGNOSIS — M2550 Pain in unspecified joint: Secondary | ICD-10-CM | POA: Diagnosis not present

## 2022-11-16 DIAGNOSIS — G8929 Other chronic pain: Secondary | ICD-10-CM | POA: Insufficient documentation

## 2022-11-16 DIAGNOSIS — Q828 Other specified congenital malformations of skin: Secondary | ICD-10-CM

## 2022-11-16 DIAGNOSIS — Z8261 Family history of arthritis: Secondary | ICD-10-CM | POA: Diagnosis not present

## 2022-11-16 DIAGNOSIS — Q999 Chromosomal abnormality, unspecified: Secondary | ICD-10-CM | POA: Insufficient documentation

## 2022-11-16 DIAGNOSIS — Z8269 Family history of other diseases of the musculoskeletal system and connective tissue: Secondary | ICD-10-CM | POA: Diagnosis not present

## 2022-11-16 NOTE — Progress Notes (Signed)
Acute Office Visit  Subjective:    Patient ID: Debbie Glover, female    DOB: [birth year], 57 y.o.   MRN: [id number]  Chief Complaint  Patient presents with   Joint Pain    Started last several months, shoulder and knees Cysts on knuckles- painful  "Mottling on hands"- worried about arthritis    HPI Patient is in today for eval of joint pain and skin changes: Chronic pain of multiple joints Reports multiple joint pain (shoulder, hands and knees), onset 6months ago, worse in AM, associated with stiffness in AM which last about . No joint swelling or redness or weakness. Mother with rheumatoid RA, scleroderma, raynald syndrome, rosacea. Diagnosed in her 78s  Keratoderma Also presents with Skin thickening on bilateral palms, first noted 4months ago. No itching, no redness, no swelling. No change in personal products. Mother with rheumatoid RA, scleroderma, raynald syndrome, rosacea. Diagnosed in her 50s   Outpatient Medications Prior to Visit  Medication Sig   Calcium-Magnesium-Vitamin D (CALCIUM 1200+D3 PO)    cholecalciferol (VITAMIN D3) 25 MCG (1000 UT) tablet Take 1,000 Units by mouth daily.   Multiple Vitamins-Minerals (ONE-A-DAY WOMENS PO)    No facility-administered medications prior to visit.   Reviewed past medical and social history.  Review of Systems Per HPI     Objective:    Physical Exam Vitals and nursing note reviewed.  Cardiovascular:     Rate and Rhythm: Normal rate and regular rhythm.     Pulses: Normal pulses.     Heart sounds: Normal heart sounds.  Pulmonary:     Effort: Pulmonary effort is normal.     Breath sounds: Normal breath sounds.  Musculoskeletal:        General: No swelling or deformity.     Right lower leg: No edema.     Left lower leg: No edema.  Skin:    Findings: No erythema or rash.     Comments: Thickening of bilateral palms. No contracture noted. No striation, no ulcers, normal nails Normal soles of feet.  Neurological:      Mental Status: She is oriented to person, place, and time.  Psychiatric:        Mood and Affect: Mood normal.        Behavior: Behavior normal.    BP 116/74   Pulse 81   Temp 98.6 F (37 C) (Oral)   Resp 16   Ht  (1.702 m)   Wt 137 lb 6 oz (62.3 kg)   SpO2 99%   BMI 21.52 kg/m    No results found for any visits on 11/16/22.     Assessment & Plan:   Problem List Items Addressed This Visit       Musculoskeletal and Integument   Keratoderma - Primary    Also presents with Skin thickening on bilateral palms, first noted 4months ago. No itching, no redness, no swelling. No change in personal products. Mother with rheumatoid RA, scleroderma, raynald syndrome, rosacea. Diagnosed in her 14s      Relevant Orders   Anti-DNA antibody, double-stranded   Anti-Scleroderma Antibody   ANA w/Reflex   C-reactive protein   Cyclic Citrul Peptide Antibody, IGG   Rheumatoid Factor   Sedimentation rate   TSH     Other   Chronic pain of multiple joints    Reports multiple joint pain (shoulder, hands and knees), onset 6months ago, worse in AM, associated with stiffness in AM which last about . No joint swelling or redness  or weakness. Mother with rheumatoid RA, scleroderma, raynald syndrome, rosacea. Diagnosed in her 38s      Relevant Orders   ANA w/Reflex   C-reactive protein   Cyclic Citrul Peptide Antibody, IGG   Rheumatoid Factor   Sedimentation rate   Family history of rheumatoid arthritis   Family history of scleroderma   No orders of the defined types were placed in this encounter.  Return if symptoms worsen or fail to improve.    Alysia Penna, NP

## 2022-11-16 NOTE — Assessment & Plan Note (Signed)
Also presents with Skin thickening on bilateral palms, first noted 4months ago. No itching, no redness, no swelling. No change in personal products. Mother with rheumatoid RA, scleroderma, raynald syndrome, rosacea. Diagnosed in her 45s

## 2022-11-16 NOTE — Patient Instructions (Signed)
Return to lab at 1pm for blood draw

## 2022-11-16 NOTE — Assessment & Plan Note (Addendum)
Reports multiple joint pain (shoulder, hands and knees), onset 6months ago, worse in AM, associated with stiffness in AM which last about . No joint swelling or redness or weakness. Mother with rheumatoid RA, scleroderma, raynald syndrome, rosacea. Diagnosed in her 80s

## 2022-11-17 ENCOUNTER — Other Ambulatory Visit (INDEPENDENT_AMBULATORY_CARE_PROVIDER_SITE_OTHER): Payer: 59

## 2022-11-17 ENCOUNTER — Telehealth: Payer: Self-pay | Admitting: Cardiovascular Disease

## 2022-11-17 DIAGNOSIS — G8929 Other chronic pain: Secondary | ICD-10-CM

## 2022-11-17 DIAGNOSIS — Q828 Other specified congenital malformations of skin: Secondary | ICD-10-CM

## 2022-11-17 DIAGNOSIS — M2550 Pain in unspecified joint: Secondary | ICD-10-CM

## 2022-11-17 DIAGNOSIS — I341 Nonrheumatic mitral (valve) prolapse: Secondary | ICD-10-CM

## 2022-11-17 LAB — SEDIMENTATION RATE: Sed Rate: 14 mm/hr (ref 0–30)

## 2022-11-17 LAB — C-REACTIVE PROTEIN: CRP: <1 mg/dL (ref 0.5–20.0)

## 2022-11-17 LAB — TSH: TSH: 0.74 u[IU]/mL (ref 0.35–5.50)

## 2022-11-17 NOTE — Telephone Encounter (Signed)
Called patient. Informed her that per echo result Dr. Eden Emms wanted her to have an echo in 3 years. Her last echo was on 02/06/2019, see note below. Placed order and patient is agreeable to have echo before her appointment in August.   Wendall Stade, MD 02/06/2019  5:42 PM EDT     No real prolapse just mild MR can consider f/u echo in 3 years

## 2022-11-17 NOTE — Telephone Encounter (Signed)
Pt would like a callback to find out if she needs to have an ECHO done before her office visit, she stated it was discussed at the last office visit so she just wanted to double check. Please advise

## 2022-11-18 LAB — ANA W/REFLEX: Anti Nuclear Antibody (ANA): NEGATIVE

## 2022-11-20 LAB — RHEUMATOID FACTOR: Rheumatoid fact SerPl-aCnc: 11 IU/mL (ref <14)

## 2022-11-20 LAB — TIQ-NTM

## 2022-11-20 LAB — CYCLIC CITRUL PEPTIDE ANTIBODY, IGG: Cyclic Citrullin Peptide Ab: <16 UNITS

## 2022-11-20 LAB — ANTI-DNA ANTIBODY, DOUBLE-STRANDED: ds DNA Ab: 2 IU/mL

## 2022-11-20 LAB — ANTI-SCLERODERMA ANTIBODY: Scleroderma (Scl-70) (ENA) Antibody, IgG: <1 AI

## 2022-11-20 NOTE — Addendum Note (Signed)
Addended by: Michaela Corner on: 11/20/2022 09:21 AM   Modules accepted: Orders

## 2022-12-15 ENCOUNTER — Ambulatory Visit (HOSPITAL_COMMUNITY): Payer: 59 | Attending: Cardiovascular Disease

## 2022-12-15 DIAGNOSIS — I341 Nonrheumatic mitral (valve) prolapse: Secondary | ICD-10-CM

## 2022-12-15 LAB — ECHOCARDIOGRAM COMPLETE
Area-P 1/2: 3.65 cm2
MV M vel: 4.24 m/s
MV Peak grad: 71.9 mmHg
S' Lateral: 2.7 cm

## 2022-12-26 ENCOUNTER — Telehealth: Payer: Self-pay

## 2022-12-26 DIAGNOSIS — I341 Nonrheumatic mitral (valve) prolapse: Secondary | ICD-10-CM

## 2022-12-26 NOTE — Telephone Encounter (Signed)
-----   Message from Wendall Stade, MD sent at 12/23/2022 10:43 AM EDT ----- EF normal possible mild prolapse mitral valve mild/mod MR should not be causing issues f/u echo in a year

## 2022-12-26 NOTE — Telephone Encounter (Signed)
Patient aware of results. Order placed for echo.

## 2022-12-27 ENCOUNTER — Encounter (HOSPITAL_COMMUNITY): Payer: Self-pay

## 2023-02-28 ENCOUNTER — Ambulatory Visit: Payer: 59 | Admitting: Dermatology

## 2023-03-01 ENCOUNTER — Other Ambulatory Visit: Payer: 59

## 2023-03-15 ENCOUNTER — Ambulatory Visit: Payer: 59 | Admitting: Cardiovascular Disease

## 2023-03-28 ENCOUNTER — Ambulatory Visit: Admission: RE | Admit: 2023-03-28 | Payer: 59 | Source: Ambulatory Visit

## 2023-03-28 DIAGNOSIS — Z9189 Other specified personal risk factors, not elsewhere classified: Secondary | ICD-10-CM

## 2023-03-28 MED ORDER — GADOPICLENOL 0.5 MMOL/ML IV SOLN
6.0000 mL | Freq: Once | INTRAVENOUS | Status: AC | PRN
  Administered 2023-03-28: 6 mL via INTRAVENOUS

## 2023-04-25 ENCOUNTER — Ambulatory Visit: Payer: 59 | Admitting: Dermatology

## 2023-04-25 ENCOUNTER — Encounter: Payer: Self-pay | Admitting: Dermatology

## 2023-04-25 VITALS — BP 133/92 | HR 42

## 2023-04-25 DIAGNOSIS — L821 Other seborrheic keratosis: Secondary | ICD-10-CM | POA: Diagnosis not present

## 2023-04-25 DIAGNOSIS — M720 Palmar fascial fibromatosis [Dupuytren]: Secondary | ICD-10-CM | POA: Diagnosis not present

## 2023-04-25 NOTE — Progress Notes (Signed)
   New Patient Visit   Subjective  Debbie Glover is a 58 y.o. female who presents for the following: Dupuytren contracture  Patient states she has hardening of the skin located at the hands that she would like to have examined. Patient reports the areas have been there for 1 years. She reports the areas are not bothersome.Patient rates irritation 0 out of 10. She states that the areas have not spread. Patient reports she has not previously been treated for these areas. Patient denies Hx of bx. Patient reports family history of skin cancer(s)(Sister).  The following portions of the chart were reviewed this encounter and updated as appropriate: medications, allergies, medical history  Review of Systems:  No other skin or systemic complaints except as noted in HPI or Assessment and Plan.  Objective  Well appearing patient in no apparent distress; mood and affect are within normal limits.  A focused examination was performed of the following areas: B/L palms of hands & Right Foot  Relevant exam findings are noted in the Assessment and Plan.                 Assessment & Plan   Dupuytren contracture Exam:  thick band like stricture involving bilateral palms with secondary contraction of 4th and 5th digits when hand is relaxed.   Treatment Plan: - Recommended a referral to Orthopedic hand and foot specialist to assess the areas - Refer the patient to a hand specialist, likely an orthopedic surgeon, for further evaluation and management.  SEBORRHEIC KERATOSIS - Stuck-on, waxy, tan-brown papules and/or plaques on left thigh - Benign-appearing - Discussed benign etiology and prognosis. - Observe - Call for any changes  Pt to return for a skin cancer screening exam    No follow-ups on file.   Documentation: I have reviewed the above documentation for accuracy and completeness, and I agree with the above.  Stasia Cavalier, am acting as scribe for Langston Reusing, DO.  Langston Reusing, DO

## 2023-04-25 NOTE — Patient Instructions (Addendum)
Hello Debbie Glover,  Thank you for visiting my office today. Your dedication to addressing your health concerns and improving your overall well-being is greatly appreciated. Here is a summary of the key instructions and recommendations from our consultation:  - Hand Specialist Consultation: It is advised to consult a hand specialist, preferably an orthopedic surgeon, for a thorough evaluation and treatment of your Dupuytren's contracture.   - Sunscreen Use: Continue diligent use of sunscreen, especially on your face. Aim for products with at least SPF 30 and remember to reapply every three hours, particularly during prolonged sun exposure.  - Product Recommendations:   - La Roche-Posay Lotion: Recommended for use on your face, neck, and chest.   - CeraVe Sunscreen: A combination mineral and chemical sunscreen suggested for beach and outdoor activities. Samples of these products are included in the goodie bags provided.  - Future Skin Examination: Schedule a thorough head-to-toe skin examination in the future to ensure all areas are checked meticulously.  It was a pleasure meeting you, and I am looking forward to assisting you further in maintaining your skin health. If you have any questions or concerns before our next meeting, please do not hesitate to contact the office.  Best regards,  Dr. Langston Reusing [facility]   Important Information   Due to recent changes in healthcare laws, you may see results of your pathology and/or laboratory studies on MyChart before the doctors have had a chance to review them. We understand that in some cases there may be results that are confusing or concerning to you. Please understand that not all results are received at the same time and often the doctors may need to interpret multiple results in order to provide you with the best plan of care or course of treatment. Therefore, we ask that you please give [place] 2 business days to thoroughly review all your results  before contacting the office for clarification. Should we see a critical lab result, you will be contacted sooner.     If You Need Anything After Your Visit   If you have any questions or concerns for your doctor, please call our main line at [phone]. If no one answers, please leave a voicemail as directed and we will return your call as soon as possible. Messages left after 4 pm will be answered the following business day.    You may also send [place] a message via MyChart. We typically respond to MyChart messages within 1-2 business days.  For prescription refills, please ask your pharmacy to contact our office. Our fax number is [phone].  If you have an urgent issue when the clinic is closed that cannot wait until the next business day, you can page your doctor at the number below.     Please note that while we do our best to be available for urgent issues outside of office hours, we are not available 24/7.    If you have an urgent issue and are unable to reach [place], you may choose to seek medical care at your doctor's office, retail clinic, urgent care center, or emergency room.   If you have a medical emergency, please immediately call 911 or go to the emergency department. In the event of inclement weather, please call our main line at [phone] for an update on the status of any delays or closures.  Dermatology Medication Tips: Please keep the boxes that topical medications come in in order to help keep track of the instructions about where and how to  use these. Pharmacies typically print the medication instructions only on the boxes and not directly on the medication tubes.   If your medication is too expensive, please contact our office at [phone] or send [place] a message through MyChart.    We are unable to tell what your co-pay for medications will be in advance as this is different depending on your insurance coverage. However, we may be able to find a substitute  medication at lower cost or fill out paperwork to get insurance to cover a needed medication.    If a prior authorization is required to get your medication covered by your insurance company, please allow [place] 1-2 business days to complete this process.   Drug prices often vary depending on where the prescription is filled and some pharmacies may offer cheaper prices.   The website www.goodrx.com contains coupons for medications through different pharmacies. The prices here do not account for what the cost may be with help from insurance (it may be cheaper with your insurance), but the website can give you the price if you did not use any insurance.  - You can print the associated coupon and take it with your prescription to the pharmacy.  - You may also stop by our office during regular business hours and pick up a GoodRx coupon card.  - If you need your prescription sent electronically to a different pharmacy, notify our office through [facility] or by phone at [phone]

## 2023-07-11 ENCOUNTER — Ambulatory Visit: Payer: 59 | Admitting: Dermatology

## 2023-07-11 ENCOUNTER — Encounter: Payer: Self-pay | Admitting: Dermatology

## 2023-07-11 DIAGNOSIS — L814 Other melanin hyperpigmentation: Secondary | ICD-10-CM | POA: Diagnosis not present

## 2023-07-11 DIAGNOSIS — L578 Other skin changes due to chronic exposure to nonionizing radiation: Secondary | ICD-10-CM | POA: Diagnosis not present

## 2023-07-11 DIAGNOSIS — D229 Melanocytic nevi, unspecified: Secondary | ICD-10-CM

## 2023-07-11 DIAGNOSIS — Z808 Family history of malignant neoplasm of other organs or systems: Secondary | ICD-10-CM

## 2023-07-11 DIAGNOSIS — L821 Other seborrheic keratosis: Secondary | ICD-10-CM

## 2023-07-11 DIAGNOSIS — D1801 Hemangioma of skin and subcutaneous tissue: Secondary | ICD-10-CM | POA: Diagnosis not present

## 2023-07-11 DIAGNOSIS — W908XXA Exposure to other nonionizing radiation, initial encounter: Secondary | ICD-10-CM

## 2023-07-11 DIAGNOSIS — Z1283 Encounter for screening for malignant neoplasm of skin: Secondary | ICD-10-CM

## 2023-07-11 NOTE — Patient Instructions (Addendum)
Hello Pam,  Thank you for visiting [place] today. We appreciate your commitment to maintaining your health and addressing your concerns promptly. Here is a summary of the key instructions from today's consultation:  - Skin Monitoring: Continue monitoring your skin, especially new moles or changes in existing ones. Focus on your face, chest, shoulders, and arms.  - Hydration: Continue using Neutrogena Body Oil as you have been doing for hydration.  - Sun Protection: For daily sun protection, consider using a facial moisturizer with sunscreen. The Toleriane Daily Facial Moisturizer is recommended for everyday use on your face, neck, and chest.  - Follow-Up: We will see you for your next annual check-up, or sooner if you notice any suspicious skin changes.  Thank you again for your visit, and I wish you a wonderful Christmas and all the best with your new house.  Warm regards,  Dr. Langston Reusing,  Dermatology Important Information  Due to recent changes in healthcare laws, you may see results of your pathology and/or laboratory studies on MyChart before the doctors have had a chance to review them. We understand that in some cases there may be results that are confusing or concerning to you. Please understand that not all results are received at the same time and often the doctors may need to interpret multiple results in order to provide you with the best plan of care or course of treatment. Therefore, we ask that you please give [place] 2 business days to thoroughly review all your results before contacting the office for clarification. Should we see a critical lab result, you will be contacted sooner.   If You Need Anything After Your Visit  If you have any questions or concerns for your doctor, please call our main line at [phone] If no one answers, please leave a voicemail as directed and we will return your call as soon as possible. Messages left after 4 pm will be answered the following  business day.   You may also send [place] a message via MyChart. We typically respond to MyChart messages within 1-2 business days.  For prescription refills, please ask your pharmacy to contact our office. Our fax number is [phone].  If you have an urgent issue when the clinic is closed that cannot wait until the next business day, you can page your doctor at the number below.    Please note that while we do our best to be available for urgent issues outside of office hours, we are not available 24/7.   If you have an urgent issue and are unable to reach [place], you may choose to seek medical care at your doctor's office, retail clinic, urgent care center, or emergency room.  If you have a medical emergency, please immediately call 911 or go to the emergency department. In the event of inclement weather, please call our main line at [phone] for an update on the status of any delays or closures.  Dermatology Medication Tips: Please keep the boxes that topical medications come in in order to help keep track of the instructions about where and how to use these. Pharmacies typically print the medication instructions only on the boxes and not directly on the medication tubes.   If your medication is too expensive, please contact our office at [phone] or send [place] a message through MyChart.   We are unable to tell what your co-pay for medications will be in advance as this is different depending on your insurance coverage. However, we may be able  to find a substitute medication at lower cost or fill out paperwork to get insurance to cover a needed medication.   If a prior authorization is required to get your medication covered by your insurance company, please allow [place] 1-2 business days to complete this process.  Drug prices often vary depending on where the prescription is filled and some pharmacies may offer cheaper prices.  The website www.goodrx.com contains coupons for medications  through different pharmacies. The prices here do not account for what the cost may be with help from insurance (it may be cheaper with your insurance), but the website can give you the price if you did not use any insurance.  - You can print the associated coupon and take it with your prescription to the pharmacy.  - You may also stop by our office during regular business hours and pick up a GoodRx coupon card.  - If you need your prescription sent electronically to a different pharmacy, notify our office through [facility] or by phone at [phone]

## 2023-07-11 NOTE — Progress Notes (Signed)
   Total Body Skin Exam (TBSE) Visit   Subjective  Debbie Glover is a 57 y.o. female who presents for the following: Skin Cancer Screening and Full Body Skin Exam  Patient presents today for follow up visit for TBSE. Patient was last evaluated on 04/25/23 for SK and Dupuytren contracture. Referred to ortho. Patient denies medication changes. Patient reports she does not have spots, moles and lesions of concern to be evaluated. Patient reports throughout her lifetime she has had complete sun exposure. Currently, patient reports if she has excessive sun exposure, she does apply sunscreen and/or wears protective coverings when she knows she will be in the sun. Patient reports she does not have hx of bx. Patient reports  family history of skin cancers. (Father - BCC, sister  -BCC)The patient has spots, moles and lesions to be evaluated, some may be new or changing and the patient has concerns that these could be cancer.  The following portions of the chart were reviewed this encounter and updated as appropriate: medications, allergies, medical history  Review of Systems:  No other skin or systemic complaints except as noted in HPI or Assessment and Plan.  Objective  Well appearing patient in no apparent distress; mood and affect are within normal limits.  A full examination was performed including scalp, head, eyes, ears, nose, lips, neck, chest, axillae, abdomen, back, buttocks, bilateral upper extremities, bilateral lower extremities, hands, feet, fingers, toes, fingernails, and toenails. All findings within normal limits unless otherwise noted below.   Relevant physical exam findings are noted in the Assessment and Plan.     Assessment & Plan   LENTIGINES, SEBORRHEIC KERATOSES, HEMANGIOMAS - Benign normal skin lesions - Benign-appearing - Call for any changes  BENIGN MELANOCYTIC NEVI - Tan-brown and/or pink-flesh-colored symmetric macules and papules - Benign appearing on exam today -  Observation - Call clinic for new or changing moles - Recommend daily use of broad spectrum spf 30+ sunscreen to sun-exposed areas.   ACTINIC DAMAGE - Chronic condition, secondary to cumulative UV/sun exposure - diffuse scaly erythematous macules with underlying dyspigmentation - Recommend daily broad spectrum sunscreen SPF 30+ to sun-exposed areas, reapply every 2 hours as needed.  - Staying in the shade or wearing long sleeves, sun glasses (UVA+UVB protection) and wide brim hats (4-inch brim around the entire circumference of the hat) are also recommended for sun protection.  - Call for new or changing lesions.  SKIN CANCER SCREENING PERFORMED TODAY.      Return in about 1 year (around 07/10/2024) for TBSE.    Documentation: I have reviewed the above documentation for accuracy and completeness, and I agree with the above.   I, Shirron Marcha Solders, CMA, am acting as scribe for [employer], DO.   Langston Reusing, DO

## 2023-07-24 ENCOUNTER — Ambulatory Visit: Payer: 59 | Admitting: Cardiovascular Disease

## 2023-09-07 ENCOUNTER — Other Ambulatory Visit: Payer: Self-pay | Admitting: Obstetrics and Gynecology

## 2023-09-07 DIAGNOSIS — Z9189 Other specified personal risk factors, not elsewhere classified: Secondary | ICD-10-CM

## 2023-12-05 ENCOUNTER — Telehealth (HOSPITAL_COMMUNITY): Payer: Self-pay | Admitting: Cardiovascular Disease

## 2023-12-05 NOTE — Telephone Encounter (Signed)
 Left message for patient to call our office if she would like to reschedule for testing or an appointment with Dr. Stann Earnest.

## 2023-12-05 NOTE — Telephone Encounter (Signed)
 Patient called and cancelled scheduled echocardiogram and did not wish to reschedule. Order will be removed from the echo WQ.

## 2023-12-25 ENCOUNTER — Other Ambulatory Visit (HOSPITAL_COMMUNITY): Payer: 59

## 2024-01-25 ENCOUNTER — Encounter: Payer: Self-pay | Admitting: Emergency Medicine

## 2024-01-25 ENCOUNTER — Ambulatory Visit: Attending: Emergency Medicine | Admitting: Emergency Medicine

## 2024-01-25 VITALS — BP 124/80 | HR 59 | Ht 67.0 in | Wt 132.4 lb

## 2024-01-25 DIAGNOSIS — I340 Nonrheumatic mitral (valve) insufficiency: Secondary | ICD-10-CM | POA: Diagnosis not present

## 2024-01-25 DIAGNOSIS — I4510 Unspecified right bundle-branch block: Secondary | ICD-10-CM | POA: Diagnosis not present

## 2024-01-25 DIAGNOSIS — E782 Mixed hyperlipidemia: Secondary | ICD-10-CM

## 2024-01-25 DIAGNOSIS — I341 Nonrheumatic mitral (valve) prolapse: Secondary | ICD-10-CM

## 2024-01-25 NOTE — Patient Instructions (Addendum)
 Medication Instructions:   Your physician recommends that you continue on your current medications as directed. Please refer to the Current Medication list given to you today.  *If you need a refill on your cardiac medications before your next appointment, please call your pharmacy*    Testing/Procedures:  Your physician has requested that you have an echocardiogram. Echocardiography is a painless test that uses sound waves to create images of your heart. It provides your doctor with information about the size and shape of your heart and how well your heart's chambers and valves are working. This procedure takes approximately one hour. There are no restrictions for this procedure. Please do NOT wear cologne, perfume, aftershave, or lotions (deodorant is allowed). Please arrive 15 minutes prior to your appointment time.  Please note: We ask at that you not bring children with you during ultrasound (echo/ vascular) testing. Due to room size and safety concerns, children are not allowed in the ultrasound rooms during exams. Our front office staff cannot provide observation of children in our lobby area while testing is being conducted. An adult accompanying a patient to their appointment will only be allowed in the ultrasound room at the discretion of the ultrasound technician under special circumstances. We apologize for any inconvenience.   Follow-Up: At [facility], you and your health needs are our priority.  As part of our continuing mission to provide you with exceptional heart care, our providers are all part of one team.  This team includes your primary Cardiologist (physician) and Advanced Practice Providers or APPs (Physician Assistants and Nurse Practitioners) who all work together to provide you with the care you need, when you need it.  Your next appointment:   1 year(s)  Provider:   Maude Emmer, MD  OR Lum Louis NP

## 2024-01-25 NOTE — Progress Notes (Signed)
 Cardiology Office Note:    Date:  01/25/2024  ID:  Debbie Glover, DOB 11-Oct-1965, MRN [id number] PCP: Katheen Roselie Rockford, NP  [facility] Providers Cardiologist:  Maude Emmer, MD       Patient Profile:       Chief Complaint: 1 year follow-up History of Present Illness:  Debbie Glover is a 58 y.o. female with visit-pertinent history of mitral valve prolapse  Patient established with cardiology service on 11/08/2018.  She moved to [place]  from [place]  in 2018.  She indicates seeing cardiology annually for MVP.  She had a single episode in 2017 with negative cardiac workup.  Echocardiogram 02/06/2019 showed LVEF 55 to 60%, flattening of the anterior leaflet of the MR but no true prolapse, there is mild MR.  CT cardiac scoring 07/2020 showed coronary calcium score of 0.  Patient was last seen in office on 12/15/2021.  She was doing well at that time.  She is doing well at the time no medication changes are made.  Echocardiogram on 12/15/2022 showed LVEF 55 to 60%, no RWMA, RV function and size normal, moderate eccentric posteriorly directed mitral regurgitation, there appears to be mild prolapse of the anterior mitral valve leaflet.   Discussed the use of AI scribe software for clinical note transcription with the patient, who gave verbal consent to proceed.  History of Present Illness Debbie Glover is a 58 year old female with mitral valve prolapse who presents for follow-up of her cardiac condition.  Today patient is doing well overall.  She is without any concerning cardiovascular concerns or complaints today.  She denies any anginal symptoms.  She denies any chest pain or shortness of breath.  No lightheadedness, syncope, presyncope, DOE, orthopnea, PND, weight gain, leg swelling.  She maintains a regular exercise routine without exertional symptoms or significant changes in physical activity or health.  Her mother also has mitral valve prolapse.   Review of systems:   Please see the history of present illness. All other systems are reviewed and otherwise negative.      Studies Reviewed:    EKG Interpretation Date/Time:  Friday January 25 2024 10:41:50 EDT Ventricular Rate:  59 PR Interval:  176 QRS Duration:  102 QT Interval:  430 QTC Calculation: 425 R Axis:   86  Text Interpretation: Sinus bradycardia Biatrial enlargement Incomplete right bundle branch block No previous ECGs available Confirmed by Rana Dixon [phone]) on 01/25/2024 11:12:40 AM    Echocardiogram 12/15/2022 1. Left ventricular ejection fraction, by estimation, is 55 to 60%. The  left ventricle has normal function. The left ventricle has no regional  wall motion abnormalities. GLS -28.1%   2. Right ventricular systolic function is normal. The right ventricular  size is normal.   3. There is moderate eccentric posteriorly directed mitral regurgitation.  There appears to be mild prolapse of the anterior mitral valve leaflet.  Consider TEE for better visualization.   4. The aortic valve is normal in structure. Aortic valve regurgitation is  not visualized. No aortic stenosis is present.   5. The inferior vena cava is normal in size with greater than 50%  respiratory variability, suggesting right atrial pressure of 3 mmHg.   Risk Assessment/Calculations:              Physical Exam:   VS:  BP 124/80   Pulse (!) 59   Ht 5' 7 (1.702 m)   Wt 132 lb 6.4 oz (60.1 kg)   SpO2 99%   BMI  20.74 kg/m    Wt Readings from Last 3 Encounters:  01/25/24 132 lb 6.4 oz (60.1 kg)  11/16/22 137 lb 6 oz (62.3 kg)  07/19/22 137 lb 3.2 oz (62.2 kg)    GEN: Well nourished, well developed in no acute distress NECK: No JVD; No carotid bruits CARDIAC: RRR, no murmurs, rubs, gallops RESPIRATORY:  Clear to auscultation without rales, wheezing or rhonchi  ABDOMEN: Soft, non-tender, non-distended EXTREMITIES:  No edema; No acute deformity      Assessment and Plan:  Mitral valve  prolapse Mitral valve regurgitation Echocardiogram 01/2019 showing mild MR with no true prolapse Echocardiogram 11/2022 with LVEF of 55 to 60%, moderate eccentric posteriorly directed mitral regurgitation and mild prolapse of the anterior mitral valve leaflet - Today patient remains entirely asymptomatic.  Remains very active without exertional symptoms.  Denies dyspnea, CP, dizziness, syncope - Given moderate MR and mild MVP will plan to repeat echocardiogram at this time for routine monitoring  Hyperlipidemia CT cardiac scoring 07/2020 with coronary calcium score of 0 LDL 127 on 06/2022  Managed by PCP  - Managed with diet and exercise  RBBB Benign - Today she mains euvolemic and well compensated on exam      Dispo:  Return in about 1 year (around 01/24/2025).  Signed, Lum LITTIE Louis, NP

## 2024-03-11 ENCOUNTER — Ambulatory Visit (HOSPITAL_COMMUNITY)
Admission: RE | Admit: 2024-03-11 | Discharge: 2024-03-11 | Disposition: A | Discharge status: Home / Self Care | Source: Ambulatory Visit | Attending: Cardiology | Admitting: Cardiology

## 2024-03-11 DIAGNOSIS — I341 Nonrheumatic mitral (valve) prolapse: Secondary | ICD-10-CM | POA: Diagnosis present

## 2024-03-11 LAB — ECHOCARDIOGRAM COMPLETE
Area-P 1/2: 3.97 cm2
S' Lateral: 2.7 cm

## 2024-03-12 ENCOUNTER — Ambulatory Visit: Payer: Self-pay | Admitting: Nurse Practitioner

## 2024-04-04 ENCOUNTER — Encounter: Payer: Self-pay | Admitting: Obstetrics and Gynecology

## 2024-04-07 ENCOUNTER — Other Ambulatory Visit: Payer: 59

## 2024-07-10 ENCOUNTER — Encounter: Payer: Self-pay | Admitting: Dermatology

## 2024-07-10 ENCOUNTER — Ambulatory Visit: Payer: 59 | Admitting: Dermatology

## 2024-07-10 VITALS — BP 141/79

## 2024-07-10 DIAGNOSIS — W908XXA Exposure to other nonionizing radiation, initial encounter: Secondary | ICD-10-CM

## 2024-07-10 DIAGNOSIS — L814 Other melanin hyperpigmentation: Secondary | ICD-10-CM

## 2024-07-10 DIAGNOSIS — D229 Melanocytic nevi, unspecified: Secondary | ICD-10-CM

## 2024-07-10 DIAGNOSIS — L821 Other seborrheic keratosis: Secondary | ICD-10-CM | POA: Diagnosis not present

## 2024-07-10 DIAGNOSIS — D1801 Hemangioma of skin and subcutaneous tissue: Secondary | ICD-10-CM | POA: Diagnosis not present

## 2024-07-10 DIAGNOSIS — L578 Other skin changes due to chronic exposure to nonionizing radiation: Secondary | ICD-10-CM

## 2024-07-10 DIAGNOSIS — Z1283 Encounter for screening for malignant neoplasm of skin: Secondary | ICD-10-CM | POA: Diagnosis not present

## 2024-07-10 DIAGNOSIS — D239 Other benign neoplasm of skin, unspecified: Secondary | ICD-10-CM

## 2024-07-10 NOTE — Progress Notes (Signed)
° °  Total Body Skin Exam (TBSE) Visit   Subjective  Debbie Glover is a 58 y.o. female ESTABLISHED PATIENT who presents for the following:  Total Body Skin Exam (TBSE)  Patient was last evaluated for TBSE on 07/11/23.  Patient does not have spots of concern to be evaluated. She does apply sunscreen and/or wears protective coverings. Patient reports she does not have hx of bx. Patient reports  family history of skin cancers. (Father - BCC, sister  -[facility])   The patient has spots, moles and lesions to be evaluated, some may be new or changing and the patient has concerns that these could be cancer.  The following portions of the chart were reviewed this encounter and updated as appropriate: medications, allergies, medical history  Review of Systems:  No other skin or systemic complaints except as noted in HPI or Assessment and Plan.  Objective  Well appearing patient in no apparent distress; mood and affect are within normal limits.  A full examination was performed including scalp, head, eyes, ears, nose, lips, neck, chest, axillae, abdomen, back, buttocks, bilateral upper extremities, bilateral lower extremities, hands, feet, fingers, toes, fingernails, and toenails. All findings within normal limits unless otherwise noted below.   Relevant physical exam findings are noted in the Assessment and Plan.   Assessment & Plan   LENTIGINES, SEBORRHEIC KERATOSES, HEMANGIOMAS - Benign normal skin lesions - Benign-appearing - Call for any changes  BENIGN MELANOCYTIC NEVI - Tan-brown and/or pink-flesh-colored symmetric macules and papules - Benign appearing on exam today - Observation - Call clinic for new or changing moles - Recommend daily use of broad spectrum spf 30+ sunscreen to sun-exposed areas.   MILD ACTINIC DAMAGE - Chronic condition, secondary to cumulative UV/sun exposure - diffuse scaly erythematous macules with underlying dyspigmentation - Recommend daily broad spectrum sunscreen  SPF 30+ to sun-exposed areas, reapply every 2 hours as needed.  - Staying in the shade or wearing long sleeves, sun glasses (UVA+UVB protection) and wide brim hats (4-inch brim around the entire circumference of the hat) are also recommended for sun protection.  - Call for new or changing lesions.  DERMATOFIBROMA Exam: Firm pink/brown papulenodule with dimple sign.  Treatment Plan: A dermatofibroma is a benign growth possibly related to trauma, such as an insect bite, cut from shaving, or inflamed acne-type bump.  Treatment options to remove include shave or excision with resulting scar and risk of recurrence.  Since benign-appearing and not bothersome, will observe for now.   SKIN CANCER SCREENING PERFORMED TODAY.  Return in about 1 year (around 07/10/2025) for TBSE.   Documentation: I have reviewed the above documentation for accuracy and completeness, and I agree with the above.  I, Debby Clyne Maranda, CMA II, am acting as scribe for:  Delon Lenis, DO

## 2024-07-10 NOTE — Patient Instructions (Addendum)
 VISIT SUMMARY:  During your visit today, we discussed the progress of your lichen sclerosus and hair regrowth treatment. Your lichen sclerosus is improving with the current treatment, and you are seeing early signs of hair regrowth with the topical treatments.  YOUR PLAN:  -LICHEN SCLEROSUS ET ATROPHICUS:  Lichen sclerosus is a chronic skin condition that causes thin, white patches of skin, usually in the genital and anal areas. Your condition is improving with the use of Latisse alternating with tacrolimus. Continue this treatment for at least one year to prevent recurrence of inflammation.  -ANDROGENIC ALOPECIA:  Androgenic alopecia is a common form of hair loss in both men and women. You are seeing early signs of regrowth with the use of topical minoxidil and finasteride. Continue applying the topical solution and massage three drops into the entire area. Use a hair dryer on a cool setting to dry the area quickly. If progress is unsatisfactory by May, we will consider oral minoxidil.  INSTRUCTIONS:  Please continue your current treatments for lichen sclerosus and hair regrowth. Follow up in May to reassess your progress, and contact us  if you have any concerns or notice any changes.       Important Information  Due to recent changes in healthcare laws, you may see results of your pathology and/or laboratory studies on MyChart before the doctors have had a chance to review them. We understand that in some cases there may be results that are confusing or concerning to you. Please understand that not all results are received at the same time and often the doctors may need to interpret multiple results in order to provide you with the best plan of care or course of treatment. Therefore, we ask that you please give us  2 business days to thoroughly review all your results before contacting the office for clarification. Should we see a critical lab result, you will be contacted sooner.   If  You Need Anything After Your Visit  If you have any questions or concerns for your doctor, please call our main line at [phone] If no one answers, please leave a voicemail as directed and we will return your call as soon as possible. Messages left after 4 pm will be answered the following business day.   You may also send us  a message via MyChart. We typically respond to MyChart messages within 1-2 business days.  For prescription refills, please ask your pharmacy to contact our office. Our fax number is [phone].  If you have an urgent issue when the clinic is closed that cannot wait until the next business day, you can page your doctor at the number below.    Please note that while we do our best to be available for urgent issues outside of office hours, we are not available 24/7.   If you have an urgent issue and are unable to reach us , you may choose to seek medical care at your doctor's office, retail clinic, urgent care center, or emergency room.  If you have a medical emergency, please immediately call 911 or go to the emergency department. In the event of inclement weather, please call our main line at [phone] for an update on the status of any delays or closures.  Dermatology Medication Tips: Please keep the boxes that topical medications come in in order to help keep track of the instructions about where and how to use these. Pharmacies typically print the medication instructions only on the boxes and not directly on the medication tubes.  If your medication is too expensive, please contact our office at [phone] or send us  a message through MyChart.   We are unable to tell what your co-pay for medications will be in advance as this is different depending on your insurance coverage. However, we may be able to find a substitute medication at lower cost or fill out paperwork to get insurance to cover a needed medication.   If a prior authorization is required to get  your medication covered by your insurance company, please allow us  1-2 business days to complete this process.  Drug prices often vary depending on where the prescription is filled and some pharmacies may offer cheaper prices.  The website www.goodrx.com contains coupons for medications through different pharmacies. The prices here do not account for what the cost may be with help from insurance (it may be cheaper with your insurance), but the website can give you the price if you did not use any insurance.  - You can print the associated coupon and take it with your prescription to the pharmacy.  - You may also stop by our office during regular business hours and pick up a GoodRx coupon card.  - If you need your prescription sent electronically to a different pharmacy, notify our office through [facility] or by phone at [phone]
